# Patient Record
Sex: Female | Born: 2000 | Race: Black or African American | Hispanic: No | Marital: Single | Smoking: Never smoker
Health system: Southern US, Community
[De-identification: ages and names within clinical notes are randomized; demographics above are authoritative.]

## PROBLEM LIST (undated history)

## (undated) DIAGNOSIS — K219 Gastro-esophageal reflux disease without esophagitis: Secondary | ICD-10-CM

---

## 2018-01-22 ENCOUNTER — Emergency Department (HOSPITAL_COMMUNITY)
Admission: EM | Admit: 2018-01-22 | Discharge: 2018-01-22 | Disposition: A | Discharge status: Home / Self Care | Payer: Medicaid Other | Attending: Emergency Medicine | Admitting: Emergency Medicine

## 2018-01-22 ENCOUNTER — Encounter (HOSPITAL_COMMUNITY): Payer: Self-pay

## 2018-01-22 DIAGNOSIS — R072 Precordial pain: Secondary | ICD-10-CM | POA: Diagnosis not present

## 2018-01-22 DIAGNOSIS — R079 Chest pain, unspecified: Secondary | ICD-10-CM | POA: Diagnosis present

## 2018-01-22 MED ORDER — IBUPROFEN 600 MG PO TABS: 600.0000 mg | ORAL_TABLET | Freq: Four times a day (QID) | ORAL | 0 refills | Status: DC | PRN

## 2018-01-22 NOTE — ED Triage Notes (Signed)
Pt presents for evaluation of R sided chest pain x 3 days. Pt reports pain starts in epigastric region and has reflux as well.

## 2018-01-22 NOTE — ED Provider Notes (Signed)
[facility] EMERGENCY DEPARTMENT Provider Note   CSN: [id number] Arrival date & time: 01/22/18  1334     History   Chief Complaint Chief Complaint  Patient presents with  . Chest Pain    HPI Kathy Leonard is a 18 y.o. female.  HPI  18 year old female comes in with chief complaint of chest pain. Patient's chest pain is located on the right side, it is intermittent with each episode lasting 20 minutes.  There is no specific walking, aggravating or relieving factors associated with it.  Patient denies shortness of breath, cough. Pt has no hx of PE, DVT and denies any exogenous hormone (testosterone / estrogen) use, long distance travels or surgery in the past 6 weeks, active cancer, recent immobilization.   History reviewed. No pertinent past medical history.  There are no active problems to display for this patient.   History reviewed. No pertinent surgical history.   OB History   No obstetric history on file.      Home Medications    Prior to Admission medications   Medication Sig Start Date End Date Taking? Authorizing Provider  ibuprofen (ADVIL,MOTRIN) 600 MG tablet Take 1 tablet (600 mg total) by mouth every 6 (six) hours as needed. 01/22/18   Derwood Kaplan, MD    Family History No family history on file.  Social History Social History   Tobacco Use  . Smoking status: Not on file  Substance Use Topics  . Alcohol use: Not on file  . Drug use: Not on file     Allergies   Patient has no known allergies.   Review of Systems Review of Systems  Constitutional: Negative for activity change.  Respiratory: Negative for shortness of breath.   Cardiovascular: Positive for chest pain. Negative for palpitations.  Gastrointestinal: Negative for nausea and vomiting.     Physical Exam Updated Vital Signs BP 115/80 (BP Location: Right Arm)   Pulse 70   Temp 98.2 F (36.8 C) (Oral)   Resp 16   LMP 01/08/2018 (Exact Date)   SpO2 100%    Physical Exam Vitals signs and nursing note reviewed.  Constitutional:      Appearance: She is well-developed.  HENT:     Head: Atraumatic.  Neck:     Musculoskeletal: Neck supple.  Cardiovascular:     Rate and Rhythm: Normal rate.  Pulmonary:     Effort: Pulmonary effort is normal.     Breath sounds: Normal breath sounds.  Abdominal:     General: Bowel sounds are normal.  Musculoskeletal:     Right lower leg: She exhibits no tenderness. No edema.     Left lower leg: She exhibits no tenderness. No edema.  Skin:    General: Skin is warm and dry.  Neurological:     Mental Status: She is alert and oriented to person, place, and time.      ED Treatments / Results  Labs (all labs ordered are listed, but only abnormal results are displayed) Labs Reviewed - No data to display  EKG EKG Interpretation  Date/Time:  Sunday January 22 2018 13:42:07 EST Ventricular Rate:  66 PR Interval:  136 QRS Duration: 82 QT Interval:  378 QTC Calculation: 396 R Axis:   74 Text Interpretation:  Normal sinus rhythm Normal ECG No acute changes No old tracing to compare Confirmed by Derwood Kaplan [id number]) on 01/22/2018 1:48:39 PM   Radiology No results found.  Procedures Procedures (including critical care time)  Medications Ordered in  ED Medications - No data to display   Initial Impression / Assessment and Plan / ED Course  I have reviewed the triage vital signs and the nursing notes.  Pertinent labs & imaging results that were available during my care of the patient were reviewed by me and considered in my medical decision making (see chart for details).     Patient comes in with chief complaint of chest pain. Patient's chest pain is right-sided, intermittent and has no specific aggravating or relieving factors.  She has no known coronary artery disease, no PE risk factors.  EKG is reassuring.  Well score is 0 and patient is PERC negative, therefore I do not think we need to  pursue further PE work-up.   Final Clinical Impressions(s) / ED Diagnoses   Final diagnoses:  Precordial chest pain    ED Discharge Orders         Ordered    ibuprofen (ADVIL,MOTRIN) 600 MG tablet  Every 6 hours PRN     01/22/18 1506           Derwood Kaplan, MD 01/22/18 1520

## 2018-01-22 NOTE — Discharge Instructions (Signed)
We saw you in the ER for the chest pain. EKG looks normal. We are not sure what is causing your discomfort, but we feel comfortable sending you home at this time. The workup in the ER is not complete, and you should follow up with your primary care doctor for further evaluation if the pain were to continue.  Please return to the ER if you have worsening chest pain, shortness of breath, pain radiating to your jaw, shoulder, or back, sweats or fainting. Otherwise see the Cardiologist or your primary care doctor as requested.

## 2018-06-14 ENCOUNTER — Other Ambulatory Visit: Payer: Self-pay

## 2018-06-14 ENCOUNTER — Ambulatory Visit (HOSPITAL_COMMUNITY): Payer: 59 | Admitting: Anesthesiology

## 2018-06-14 ENCOUNTER — Ambulatory Visit (HOSPITAL_COMMUNITY)
Admission: AD | Admit: 2018-06-14 | Discharge: 2018-06-14 | Disposition: A | Discharge status: Home / Self Care | Payer: 59 | Attending: Obstetrics and Gynecology | Admitting: Obstetrics and Gynecology

## 2018-06-14 ENCOUNTER — Encounter (HOSPITAL_COMMUNITY): Payer: Self-pay | Admitting: Orthopedic Surgery

## 2018-06-14 ENCOUNTER — Encounter (HOSPITAL_COMMUNITY)
Admission: AD | Disposition: A | Discharge status: Home / Self Care | Payer: Self-pay | Source: Home / Self Care | Attending: Obstetrics and Gynecology

## 2018-06-14 DIAGNOSIS — Z3A08 8 weeks gestation of pregnancy: Secondary | ICD-10-CM | POA: Diagnosis not present

## 2018-06-14 DIAGNOSIS — O021 Missed abortion: Secondary | ICD-10-CM | POA: Insufficient documentation

## 2018-06-14 HISTORY — DX: Gastro-esophageal reflux disease without esophagitis: K21.9

## 2018-06-14 HISTORY — PX: DILATION AND EVACUATION: SHX1459

## 2018-06-14 LAB — TYPE AND SCREEN
ABO/RH(D): B POS
Antibody Screen: NEGATIVE

## 2018-06-14 SURGERY — DILATION AND EVACUATION, UTERUS
Anesthesia: General

## 2018-06-14 MED ORDER — LIDOCAINE HCL (CARDIAC) PF 100 MG/5ML IV SOSY
PREFILLED_SYRINGE | INTRAVENOUS | Status: DC | PRN
  Administered 2018-06-14: 60 mg via INTRAVENOUS

## 2018-06-14 MED ORDER — PROPOFOL 10 MG/ML IV BOLUS
INTRAVENOUS | Status: DC | PRN
  Administered 2018-06-14: 30 mg via INTRAVENOUS
  Administered 2018-06-14: 200 mg via INTRAVENOUS

## 2018-06-14 MED ORDER — PROPOFOL 10 MG/ML IV BOLUS
INTRAVENOUS | Status: AC
  Filled 2018-06-14: qty 20

## 2018-06-14 MED ORDER — LIDOCAINE HCL 1 % IJ SOLN
INTRAMUSCULAR | Status: DC | PRN
  Administered 2018-06-14: 10 mL

## 2018-06-14 MED ORDER — OXYCODONE-ACETAMINOPHEN 5-325 MG PO TABS
ORAL_TABLET | ORAL | Status: AC
  Filled 2018-06-14: qty 1

## 2018-06-14 MED ORDER — OXYCODONE HCL 5 MG PO TABS: 5.0000 mg | ORAL_TABLET | ORAL | 0 refills | Status: AC | PRN

## 2018-06-14 MED ORDER — ACETAMINOPHEN 500 MG PO TABS
1000.0000 mg | ORAL_TABLET | ORAL | Status: AC
  Administered 2018-06-14: 1000 mg via ORAL
  Filled 2018-06-14: qty 2

## 2018-06-14 MED ORDER — KETOROLAC TROMETHAMINE 30 MG/ML IJ SOLN
30.0000 mg | Freq: Once | INTRAMUSCULAR | Status: AC
  Administered 2018-06-14: 30 mg via INTRAVENOUS

## 2018-06-14 MED ORDER — LIDOCAINE HCL (PF) 1 % IJ SOLN
INTRAMUSCULAR | Status: AC
  Filled 2018-06-14: qty 30

## 2018-06-14 MED ORDER — MIDAZOLAM HCL 5 MG/5ML IJ SOLN
INTRAMUSCULAR | Status: DC | PRN
  Administered 2018-06-14: 2 mg via INTRAVENOUS

## 2018-06-14 MED ORDER — SCOPOLAMINE 1 MG/3DAYS TD PT72
MEDICATED_PATCH | TRANSDERMAL | Status: AC
  Administered 2018-06-14: 1.5 mg via TRANSDERMAL
  Filled 2018-06-14: qty 1

## 2018-06-14 MED ORDER — DOXYCYCLINE HYCLATE 100 MG IV SOLR
200.0000 mg | Freq: Once | INTRAVENOUS | Status: AC
  Administered 2018-06-14: 200 mg via INTRAVENOUS
  Filled 2018-06-14: qty 200

## 2018-06-14 MED ORDER — KETOROLAC TROMETHAMINE 30 MG/ML IJ SOLN
INTRAMUSCULAR | Status: AC
  Filled 2018-06-14: qty 1

## 2018-06-14 MED ORDER — MIDAZOLAM HCL 2 MG/2ML IJ SOLN
INTRAMUSCULAR | Status: AC
  Filled 2018-06-14: qty 2

## 2018-06-14 MED ORDER — LACTATED RINGERS IV SOLN
INTRAVENOUS | Status: DC
  Administered 2018-06-14 (×2): via INTRAVENOUS

## 2018-06-14 MED ORDER — OXYCODONE-ACETAMINOPHEN 5-325 MG PO TABS
1.0000 | ORAL_TABLET | Freq: Once | ORAL | Status: AC
  Administered 2018-06-14: 1 via ORAL

## 2018-06-14 MED ORDER — DEXAMETHASONE SODIUM PHOSPHATE 4 MG/ML IJ SOLN
INTRAMUSCULAR | Status: DC | PRN
  Administered 2018-06-14: 5 mg via INTRAVENOUS

## 2018-06-14 MED ORDER — FENTANYL CITRATE (PF) 250 MCG/5ML IJ SOLN
INTRAMUSCULAR | Status: AC
  Filled 2018-06-14: qty 5

## 2018-06-14 MED ORDER — OXYCODONE HCL 5 MG PO TABS
ORAL_TABLET | ORAL | Status: AC
  Filled 2018-06-14: qty 1

## 2018-06-14 MED ORDER — FENTANYL CITRATE (PF) 100 MCG/2ML IJ SOLN
INTRAMUSCULAR | Status: DC | PRN
  Administered 2018-06-14: 50 ug via INTRAVENOUS

## 2018-06-14 MED ORDER — ONDANSETRON HCL 4 MG/2ML IJ SOLN
INTRAMUSCULAR | Status: DC | PRN
  Administered 2018-06-14: 4 mg via INTRAVENOUS

## 2018-06-14 MED ORDER — SCOPOLAMINE 1 MG/3DAYS TD PT72
1.0000 | MEDICATED_PATCH | TRANSDERMAL | Status: DC
  Administered 2018-06-14: 1.5 mg via TRANSDERMAL
  Filled 2018-06-14: qty 1

## 2018-06-14 SURGICAL SUPPLY — 20 items
CATH ROBINSON RED A/P 16FR (CATHETERS) ×3 IMPLANT
GLOVE BIO SURGEON STRL SZ 6.5 (GLOVE) ×2 IMPLANT
GLOVE BIO SURGEONS STRL SZ 6.5 (GLOVE) ×1
GLOVE BIOGEL PI IND STRL 7.0 (GLOVE) ×1 IMPLANT
GLOVE BIOGEL PI INDICATOR 7.0 (GLOVE) ×2
GOWN STRL REUS W/ TWL LRG LVL3 (GOWN DISPOSABLE) ×2 IMPLANT
GOWN STRL REUS W/TWL LRG LVL3 (GOWN DISPOSABLE) ×4
HIBICLENS CHG 4% 4OZ BTL (MISCELLANEOUS) ×3 IMPLANT
HOSE CONNECTING 18IN BERKELEY (TUBING) ×3 IMPLANT
KIT BERKELEY 1ST TRIMESTER 3/8 (MISCELLANEOUS) ×3 IMPLANT
KIT BERKELEY 2ND TRIMESTER 1/2 (COLLECTOR) ×3 IMPLANT
PACK VAGINAL MINOR WOMEN LF (CUSTOM PROCEDURE TRAY) ×3 IMPLANT
PAD OB MATERNITY 4.3X12.25 (PERSONAL CARE ITEMS) ×3 IMPLANT
SET BERKELEY SUCTION TUBING (SUCTIONS) ×3 IMPLANT
TOWEL GREEN STERILE FF (TOWEL DISPOSABLE) ×6 IMPLANT
UNDERPAD 30X30 (UNDERPADS AND DIAPERS) ×3 IMPLANT
VACURETTE 10 RIGID CVD (CANNULA) IMPLANT
VACURETTE 7MM CVD STRL WRAP (CANNULA) IMPLANT
VACURETTE 8 RIGID CVD (CANNULA) ×3 IMPLANT
VACURETTE 9 RIGID CVD (CANNULA) IMPLANT

## 2018-06-14 NOTE — Op Note (Signed)
Operative Note    Preoperative Diagnosis: Missed abortion at 8 6/7wks   Postoperative Diagnosis: Same   Procedure: Suction dilation and evacuation   Surgeon: Mickle Mallory DO  Anesthesia: General LMA  Fluids: LR at 642ml EBL: 60ml UOP: 38ml   Findings: Uterus sounded to 10cm, anteverted   Specimen:  Products of conception   Procedure Note Consent was verified with pt. Patient was taken to the operating room where LMA anesthesia was obtained without difficulty. She was then prepped and draped in the normal sterile fashion in the dorsal lithotomy position. An appropriate time out was performed. An exam under anesthesia noted an anteverted uterus.  A speculum was then placed within the vagina and the anterior lip of the cervix identified and grasped with a single toothed tenaculum. 1% lidocaine was injected at 3 and 9 oclock.  Uterus was then sounded to 10cm.  The Pratt dilators were utilized to dilate the cervix up to approximately 25. A size 8 curved cannula was then inserted into the uterine cavity and suction begun. There was moderate amount of products evacuated. After 4 passes, a sharp curettage was then performed with a gritty texture noted in all quadrants. . Four more passes with the suction cannula were done. Minimal amount of products were returned this time.  Hence all instruments were removed from the vagina.  The tenaculum site was noted to be hemostatic.  There was scant to no bleeding from cervix. The speculum was removed from the vagina and the patient awakened and taken to the recovery room in good condition.  Counts correct per nursing staff

## 2018-06-14 NOTE — Anesthesia Procedure Notes (Signed)
Procedure Name: LMA Insertion Date/Time: 06/14/2018 5:53 PM Performed by: Oletta Lamas, CRNA Pre-anesthesia Checklist: Patient identified, Emergency Drugs available, Suction available and Patient being monitored Patient Re-evaluated:Patient Re-evaluated prior to induction Oxygen Delivery Method: Circle System Utilized Preoxygenation: Pre-oxygenation with 100% oxygen Induction Type: IV induction Ventilation: Mask ventilation without difficulty LMA: LMA inserted LMA Size: 4.0 Number of attempts: 1 Placement Confirmation: positive ETCO2 Tube secured with: Tape Dental Injury: Teeth and Oropharynx as per pre-operative assessment

## 2018-06-14 NOTE — Transfer of Care (Signed)
Immediate Anesthesia Transfer of Care Note  Patient: Kathy Leonard  Procedure(s) Performed: SUCTION DILATATION AND EVACUATION (N/A )  Patient Location: PACU  Anesthesia Type:General  Level of Consciousness: awake, drowsy and patient cooperative  Airway & Oxygen Therapy: Patient Spontanous Breathing  Post-op Assessment: Report given to RN and Post -op Vital signs reviewed and stable  Post vital signs: Reviewed and stable  Last Vitals:  Vitals Value Taken Time  BP    Temp 36.7 C 06/14/2018  8:32 PM  Pulse 83 06/14/2018  8:32 PM  Resp 18 06/14/2018  8:32 PM  SpO2 100 % 06/14/2018  8:32 PM    Last Pain:  Vitals:   06/14/18 2032  PainSc: 0-No pain      Patients Stated Pain Goal: 3 (38/10/17 5102)  Complications: No apparent anesthesia complications

## 2018-06-14 NOTE — Discharge Instructions (Signed)
Call office with any concerns [phone] 

## 2018-06-14 NOTE — H&P (Signed)
Kathy Leonard is an 18 y.o. G1 female with missed abortion diagnosed yesterday when she had a pregnancy confirmation appointment. Pt has had no bleeding or cramping. She had initially wanted conservative management but overnight decided on surgical management due to anxiety about bleeding. She has no chronic medical conditions. She measured 8wks and 6days per dates on [place].   Pertinent Gynecological History: DES exposure: unknown Blood transfusions: none Sexually transmitted diseases: none Previous GYN Procedures: none  Last mammogram: never Last pap: never OB History: G1, P0010  Menstrual History: Menarche age: 76 No LMP recorded.    No past medical history on file.  No past surgical history on file.  No family history on file.  Social History:  has no history on file for tobacco, alcohol, and drug.  Allergies: No Known Allergies  No medications prior to admission.    ROS  There were no vitals taken for this visit. Physical Exam  No results found for this or any previous visit (from the past 24 hour(s)).  No results found.  Assessment/Plan: Prime with 8wk missed abortion opting for surgical management via Suction Dilation and Evacuation Procedure reviewed and all questions including risks/benefits answered  Doxycycline pre and post op ERAS protocol To OR when ready; consent confirmed  Isaiah Serge 06/14/2018, 3:14 PM

## 2018-06-14 NOTE — OR Nursing (Signed)
Per Dr. Terri Piedra, the [facility] chromosome test for products of conception needs to be kept in the lab refrigerator until tomorrow, 06/15/2018. Specimen prepared by Dr. Terri Piedra in [place] [facility] per instructions on test kit insert and placed back in manufacturer's box. Box placed in biohazard bag and walked to lab by circulator in room, Nash Mantis, RN. Spoke directly to lab staff, Shirlean Schlein, verbalizing instructions per Dr. Terri Piedra. Lab staff placed the specimen container box in lab refrigerator and made note on the lab communication board about the test. Dr. Terri Piedra to follow up.

## 2018-06-14 NOTE — Interval H&P Note (Signed)
History and Physical Interval Note: Pt seen. No new changes. Ready to proceed when OR ready  06/14/2018 6:52 PM  ZOXWRUEA Kreiter  has presented today for surgery, with the diagnosis of mis abortion.  The various methods of treatment have been discussed with the patient and family. After consideration of risks, benefits and other options for treatment, the patient has consented to  Procedure(s): SUCTION DILATATION AND EVACUATION (N/A) as a surgical intervention.  The patient's history has been reviewed, patient examined, no change in status, stable for surgery.  I have reviewed the patient's chart and labs.  Questions were answered to the patient's satisfaction.     Kathy Leonard

## 2018-06-15 ENCOUNTER — Encounter (HOSPITAL_COMMUNITY): Payer: Self-pay | Admitting: Obstetrics and Gynecology

## 2018-06-15 LAB — ABO/RH: ABO/RH(D): B POS

## 2018-06-19 ENCOUNTER — Inpatient Hospital Stay (HOSPITAL_COMMUNITY): Payer: 59

## 2018-06-19 ENCOUNTER — Encounter (HOSPITAL_COMMUNITY): Payer: Self-pay | Admitting: *Deleted

## 2018-06-19 ENCOUNTER — Inpatient Hospital Stay (HOSPITAL_COMMUNITY)
Admission: AD | Admit: 2018-06-19 | Discharge: 2018-06-20 | Disposition: A | Discharge status: Home / Self Care | Payer: 59 | Attending: Obstetrics and Gynecology | Admitting: Obstetrics and Gynecology

## 2018-06-19 ENCOUNTER — Other Ambulatory Visit: Payer: Self-pay

## 2018-06-19 DIAGNOSIS — Z8759 Personal history of other complications of pregnancy, childbirth and the puerperium: Secondary | ICD-10-CM | POA: Diagnosis not present

## 2018-06-19 DIAGNOSIS — R109 Unspecified abdominal pain: Secondary | ICD-10-CM

## 2018-06-19 DIAGNOSIS — R1033 Periumbilical pain: Secondary | ICD-10-CM | POA: Insufficient documentation

## 2018-06-19 DIAGNOSIS — R1011 Right upper quadrant pain: Secondary | ICD-10-CM | POA: Diagnosis not present

## 2018-06-19 LAB — COMPREHENSIVE METABOLIC PANEL
ALT: 45 U/L — ABNORMAL HIGH (ref 0–44)
AST: 27 U/L (ref 15–41)
Albumin: 3.5 g/dL (ref 3.5–5.0)
Alkaline Phosphatase: 51 U/L (ref 38–126)
Anion gap: 8 (ref 5–15)
BUN: 8 mg/dL (ref 6–20)
CO2: 23 mmol/L (ref 22–32)
Calcium: 9.1 mg/dL (ref 8.9–10.3)
Chloride: 105 mmol/L (ref 98–111)
Creatinine, Ser: 0.73 mg/dL (ref 0.44–1.00)
GFR calc Af Amer: >60 mL/min (ref >60)
GFR calc non Af Amer: >60 mL/min (ref >60)
Glucose, Bld: 97 mg/dL (ref 70–99)
Potassium: 3.6 mmol/L (ref 3.5–5.1)
Sodium: 136 mmol/L (ref 135–145)
Total Bilirubin: 0.4 mg/dL (ref 0.3–1.2)
Total Protein: 6.8 g/dL (ref 6.5–8.1)

## 2018-06-19 LAB — CBC
HCT: 35.8 % — ABNORMAL LOW (ref 36.0–46.0)
Hemoglobin: 11.9 g/dL — ABNORMAL LOW (ref 12.0–15.0)
MCH: 30.4 pg (ref 26.0–34.0)
MCHC: 33.2 g/dL (ref 30.0–36.0)
MCV: 91.3 fL (ref 80.0–100.0)
Platelets: 217 10*3/uL (ref 150–400)
RBC: 3.92 MIL/uL (ref 3.87–5.11)
RDW: 11.9 % (ref 11.5–15.5)
WBC: 6.2 10*3/uL (ref 4.0–10.5)
nRBC: 0 % (ref 0.0–0.2)

## 2018-06-19 MED ORDER — IOHEXOL 300 MG/ML  SOLN
100.0000 mL | Freq: Once | INTRAMUSCULAR | Status: AC | PRN
  Administered 2018-06-19: 100 mL via INTRAVENOUS

## 2018-06-19 MED ORDER — LACTATED RINGERS IV SOLN
Freq: Once | INTRAVENOUS | Status: AC
  Administered 2018-06-19: 23:00:00 via INTRAVENOUS

## 2018-06-19 NOTE — MAU Note (Addendum)
C/o of mid abdominal pain since Saturday, Has D&E on on 6/10, bleeding and passing clots.  Had 800mg  ibuprofen around 1700.

## 2018-06-19 NOTE — MAU Provider Note (Addendum)
History     CSN: [id number]  Arrival date and time: 06/19/18 1749   First Provider Initiated Contact with Patient 06/19/18 1916      Chief Complaint  Patient presents with  . Abdominal Cramping  . recent DC   HPI Kathy Leonard 18 y.o. Comes to MAU today for evaluation of umbilical pain - S/P D&C on 06-14-18.  Was seen in the office earlier today for this problem.  Unknown if this is a problem associated with the D&C.  Sent to the hospital for further evaluation and ultrasound.  Bleeding was lighter than menses but yesterday the bleeding increased and today she has had tangerine sized clots.  Has been taking ibuprofen since the surgery.  Ibuprofen does not relieve the pain associated with the umbilicus.  Took Ibuprofen last at 11 am and 5 pm.  Currently pain is 7/10 when lying still and worse when moving.  When the pain started on Saturday it was 5/10.  Client reports looser stools on Saturday and stools every other day - not daily like they had been before the D&C.  Today had 5 chicken wings to eat - normal diet.  OB History   No obstetric history on file.     Past Medical History:  Diagnosis Date  . GERD (gastroesophageal reflux disease)     Past Surgical History:  Procedure Laterality Date  . DILATION AND EVACUATION N/A 06/14/2018   Procedure: SUCTION DILATATION AND EVACUATION;  Surgeon: Sherlyn Hay, DO;  Location: [place];  Service: Gynecology;  Laterality: N/A;    No family history on file.  Social History   Tobacco Use  . Smoking status: Never Smoker  . Smokeless tobacco: Never Used  Substance Use Topics  . Alcohol use: Never    Frequency: Never  . Drug use: Never    Allergies: No Known Allergies  Medications Prior to Admission  Medication Sig Dispense Refill Last Dose  . ibuprofen (ADVIL,MOTRIN) 600 MG tablet Take 1 tablet (600 mg total) by mouth every 6 (six) hours as needed. 20 tablet 0     Review of Systems  Constitutional: Negative for fever.   Respiratory: Negative for cough and shortness of breath.   Gastrointestinal: Positive for abdominal pain. Negative for constipation, diarrhea, nausea and vomiting.  Genitourinary: Positive for vaginal bleeding. Negative for dysuria and vaginal discharge.   Physical Exam   Blood pressure 129/74, pulse 72, temperature 97.6 F (36.4 C), temperature source Oral, resp. rate 16, SpO2 100 %.  Physical Exam  Nursing note and vitals reviewed. Constitutional: She is oriented to person, place, and time. She appears well-developed and well-nourished.  HENT:  Head: Normocephalic.  Eyes: EOM are normal.  Neck: Neck supple.  GI: Soft. There is abdominal tenderness. There is no rebound and no guarding.  Tenderness is around and under the umbilicus.  Client had to remove her umbilical piercing as it became to uncomfortable to wear.  No evidence of swelling or erythema near the piercing site.  Most of the pain is below the umbilicus but well above the level of the uterus.  Very mild tenderness in the low midline.  Worst pain is just below the umbilicus where the patient has guarding and full palpation cannot be done.  There is no hard areas palpated.  No visible distortion.  No bulging with movement.  No diastasis palpated.  Musculoskeletal: Normal range of motion.  Neurological: She is alert and oriented to person, place, and time.  Skin: Skin is warm  and dry.  Psychiatric: She has a normal mood and affect.    MAU Course  Procedures Results for orders placed or performed during the hospital encounter of 06/19/18 (from the past 24 hour(s))  CBC     Status: Abnormal   Collection Time: 06/19/18  7:31 PM  Result Value Ref Range   WBC 6.2 4.0 - 10.5 K/uL   RBC 3.92 3.87 - 5.11 MIL/uL   Hemoglobin 11.9 (L) 12.0 - 15.0 g/dL   HCT 09.835.8 (L) 11.936.0 - 14.746.0 %   MCV 91.3 80.0 - 100.0 fL   MCH 30.4 26.0 - 34.0 pg   MCHC 33.2 30.0 - 36.0 g/dL   RDW 82.911.9 56.211.5 - 13.015.5 %   Platelets 217 150 - 400 K/uL   nRBC 0.0  0.0 - 0.2 %   CLINICAL DATA:  Umbilical/mid abdominal pain pain. Five days after D and C.  EXAM: TRANSABDOMINAL ULTRASOUND OF PELVIS  TECHNIQUE: Transabdominal ultrasound examination of the pelvis was performed including evaluation of the uterus, ovaries, adnexal regions, and pelvic cul-de-sac.  COMPARISON:  None.  FINDINGS: Uterus  Measurements: 7.6 x 4.4 x 4.6 cm = volume: 81.2 mL. No fibroids or other mass visualized.  Endometrium  Thickness: 5.4 mm. No focal abnormality visualized. No endometrial vascularity.  Right ovary  Measurements: 3.5 x 1.3 x 1.5 cm = volume: 3.5 mL. Normal appearance/no adnexal mass.  Left ovary  Measurements: 4.1 x 2.3 x 2 cm = volume: 9.9 mL. Normal appearance/no adnexal mass.  Other findings:  Trace free fluid in the right adnexa.  IMPRESSION: Unremarkable pelvic ultrasound. Thin endometrium without endometrial vascularity.  CLINICAL DATA:  Umbilical pain  EXAM: ULTRASOUND ABDOMEN LIMITED RIGHT UPPER QUADRANT  COMPARISON:  None.  FINDINGS: Gallbladder:  No gallstones or wall thickening visualized. No sonographic Murphy sign noted by sonographer.  Common bile duct:  Diameter: 4.3 mm  Liver:  No focal lesion identified. Within normal limits in parenchymal echogenicity. Portal vein is patent on color Doppler imaging with normal direction of blood flow towards the liver.  IMPRESSION: Negative right upper quadrant ultrasound   MDM CBC done.  WBC normal. Nothing found on pelvic US and RUQ ultrasound.  Umbilicus was not evaluated with ultrasound.  Continues to have pain at the site the same as in the earlier exam. Consult with Dr. Macon LargeAnyanwu and Dr. Mindi SlickerBanga - ordered Complete abdominal CT with contrast.  Discussed with patient.  IVF started and CMP ordered. Care assumed by Lanice ShirtsV Kemo Spruce, CMN at 2130  Terri L Burleson 06/19/2018,9:34PM  CT pending upon care being assumed   0020: CT results reviewed    Ct Abdomen Pelvis W Contrast  Result Date: 06/20/2018 CLINICAL DATA:  Umbilical pain EXAM: CT ABDOMEN AND PELVIS WITH CONTRAST TECHNIQUE: Multidetector CT imaging of the abdomen and pelvis was performed using the standard protocol following bolus administration of intravenous contrast. CONTRAST:  100mL OMNIPAQUE IOHEXOL 300 MG/ML  SOLN COMPARISON:  Right upper quadrant and pelvic ultrasounds FINDINGS: Lower chest: Lung bases are clear. No effusions. Heart is normal size. Hepatobiliary: No focal hepatic abnormality. Gallbladder unremarkable. Pancreas: No focal abnormality or ductal dilatation. Spleen: No focal abnormality.  Normal size. Adrenals/Urinary Tract: No adrenal abnormality. No focal renal abnormality. No stones or hydronephrosis. Urinary bladder is unremarkable. Stomach/Bowel: Stomach, large and small bowel grossly unremarkable. Normal appendix. Vascular/Lymphatic: No evidence of aneurysm or adenopathy. Reproductive: Uterus and adnexa unremarkable.  No mass. Other: No free fluid or free air. Musculoskeletal: No acute bony abnormality. IMPRESSION: Normal study. Electronically  Signed   By: Charlett NoseKevin  Dover M.D.   On: 06/20/2018 00:06   Normal CT no abnormalities found on study, physical assessment of abdomen performed by CNM prior to discharge home.  IV 1mg  Dilaudid given for pain prior to assessment.  Abdominal pain and tenderness at 4-5 o'clock of umbilicus, less than 1cm bulge present where tenderness associated when patient performs sit up- possible early hernia unable to seen on CT or US.   Educated and discussed watching area for changes in color, size or increased pain. Instructed to rest and relax over next couple of days and not lift anything heavy, patient verbalizes understanding. [facility] with any of the above symptoms. Follow up as scheduled in the [facility] for post [facility] appointment. Rx for Ultram sent to pharmacy for abdominal pain. Pt  stable at time of discharge.   Assessment and Plan   1. Umbilical pain   2. Abdominal cramping    Discharge home  Umbilical hernia precautions, s/s and reasons to [facility]  Rest and relaxation of abdominal muscles over next 1-2 days  Follow up as scheduled in office for postop appointment   Follow-up Information    [facility].   Why: Follow up as scheduled in the office in the morning  Contact information: [address]  [address] [place] [address] [phone]          Sharyon CableVeronica C Jamyria Ozanich, CNM 06/20/18, 1:37 AM

## 2018-06-20 ENCOUNTER — Encounter (HOSPITAL_COMMUNITY): Payer: Self-pay | Admitting: Obstetrics and Gynecology

## 2018-06-20 DIAGNOSIS — R109 Unspecified abdominal pain: Secondary | ICD-10-CM

## 2018-06-20 DIAGNOSIS — R1033 Periumbilical pain: Secondary | ICD-10-CM

## 2018-06-20 MED ORDER — TRAMADOL HCL 50 MG PO TABS: 50.0000 mg | ORAL_TABLET | Freq: Four times a day (QID) | ORAL | 0 refills | Status: AC | PRN

## 2018-06-20 MED ORDER — HYDROMORPHONE HCL 1 MG/ML IJ SOLN
1.0000 mg | Freq: Once | INTRAMUSCULAR | Status: AC
  Administered 2018-06-20: 1 mg via INTRAVENOUS
  Filled 2018-06-20: qty 1

## 2018-06-20 NOTE — Anesthesia Preprocedure Evaluation (Addendum)
Anesthesia Evaluation  Patient identified by MRN, date of birth, ID band Patient awake    Reviewed: Allergy & Precautions, NPO status , Patient's Chart, lab work & pertinent test results  History of Anesthesia Complications Negative for: history of anesthetic complications  Airway Mallampati: II  TM Distance: >3 FB Neck ROM: Full    Dental  (+) Dental Advisory Given   Pulmonary neg pulmonary ROS,    breath sounds clear to auscultation       Cardiovascular negative cardio ROS   Rhythm:Regular     Neuro/Psych negative neurological ROS  negative psych ROS   GI/Hepatic Neg liver ROS, GERD  Controlled,  Endo/Other  negative endocrine ROS  Renal/GU negative Renal ROS     Musculoskeletal negative musculoskeletal ROS (+)   Abdominal   Peds  Hematology negative hematology ROS (+)   Anesthesia Other Findings   Reproductive/Obstetrics Missed abortion                             Anesthesia Physical Anesthesia Plan  ASA: I  Anesthesia Plan: General   Post-op Pain Management:    Induction: Intravenous  PONV Risk Score and Plan: 3 and Treatment may vary due to age or medical condition, Propofol infusion, Ondansetron and Dexamethasone  Airway Management Planned: LMA and Oral ETT  Additional Equipment: None  Intra-op Plan:   Post-operative Plan: Extubation in OR  Informed Consent: I have reviewed the patients History and Physical, chart, labs and discussed the procedure including the risks, benefits and alternatives for the proposed anesthesia with the patient or authorized representative who has indicated his/her understanding and acceptance.     Dental advisory given  Plan Discussed with: CRNA and Surgeon  Anesthesia Plan Comments:        Anesthesia Quick Evaluation

## 2018-06-20 NOTE — Anesthesia Postprocedure Evaluation (Signed)
Anesthesia Post Note  Patient: Kathy Leonard  Procedure(s) Performed: SUCTION DILATATION AND EVACUATION (N/A )     Patient location during evaluation: PACU Anesthesia Type: General Level of consciousness: awake and alert Pain management: pain level controlled Vital Signs Assessment: post-procedure vital signs reviewed and stable Respiratory status: spontaneous breathing, nonlabored ventilation, respiratory function stable and patient connected to nasal cannula oxygen Cardiovascular status: blood pressure returned to baseline and stable Postop Assessment: no apparent nausea or vomiting Anesthetic complications: no    Last Vitals:  Vitals:   06/14/18 2045 06/14/18 2100  BP: 121/80 113/66  Pulse: 63 63  Resp: 15 17  Temp:  36.7 C  SpO2: 98% 100%    Last Pain:  Vitals:   06/14/18 2100  PainSc: 0-No pain                 Rakeem Colley

## 2018-09-25 ENCOUNTER — Emergency Department (HOSPITAL_COMMUNITY)
Admission: EM | Admit: 2018-09-25 | Discharge: 2018-09-26 | Disposition: A | Discharge status: Home / Self Care | Payer: 59 | Attending: Emergency Medicine | Admitting: Emergency Medicine

## 2018-09-25 ENCOUNTER — Encounter (HOSPITAL_COMMUNITY): Payer: Self-pay

## 2018-09-25 ENCOUNTER — Other Ambulatory Visit: Payer: Self-pay

## 2018-09-25 DIAGNOSIS — R1032 Left lower quadrant pain: Secondary | ICD-10-CM | POA: Diagnosis present

## 2018-09-25 DIAGNOSIS — Z5321 Procedure and treatment not carried out due to patient leaving prior to being seen by health care provider: Secondary | ICD-10-CM | POA: Insufficient documentation

## 2018-09-25 LAB — COMPREHENSIVE METABOLIC PANEL
ALT: 18 U/L (ref 0–44)
AST: 16 U/L (ref 15–41)
Albumin: 3.8 g/dL (ref 3.5–5.0)
Alkaline Phosphatase: 50 U/L (ref 38–126)
Anion gap: 7 (ref 5–15)
BUN: 12 mg/dL (ref 6–20)
CO2: 22 mmol/L (ref 22–32)
Calcium: 9.4 mg/dL (ref 8.9–10.3)
Chloride: 108 mmol/L (ref 98–111)
Creatinine, Ser: 0.84 mg/dL (ref 0.44–1.00)
GFR calc Af Amer: >60 mL/min (ref >60)
GFR calc non Af Amer: >60 mL/min (ref >60)
Glucose, Bld: 93 mg/dL (ref 70–99)
Potassium: 3.9 mmol/L (ref 3.5–5.1)
Sodium: 137 mmol/L (ref 135–145)
Total Bilirubin: 0.2 mg/dL — ABNORMAL LOW (ref 0.3–1.2)
Total Protein: 7.4 g/dL (ref 6.5–8.1)

## 2018-09-25 LAB — CBC
HCT: 38.4 % (ref 36.0–46.0)
Hemoglobin: 13.1 g/dL (ref 12.0–15.0)
MCH: 31.2 pg (ref 26.0–34.0)
MCHC: 34.1 g/dL (ref 30.0–36.0)
MCV: 91.4 fL (ref 80.0–100.0)
Platelets: 178 10*3/uL (ref 150–400)
RBC: 4.2 MIL/uL (ref 3.87–5.11)
RDW: 11.4 % — ABNORMAL LOW (ref 11.5–15.5)
WBC: 8.8 10*3/uL (ref 4.0–10.5)
nRBC: 0 % (ref 0.0–0.2)

## 2018-09-25 LAB — I-STAT BETA HCG BLOOD, ED (MC, WL, AP ONLY): I-stat hCG, quantitative: <5 m[IU]/mL (ref <5)

## 2018-09-25 LAB — LIPASE, BLOOD: Lipase: 34 U/L (ref 11–51)

## 2018-09-25 MED ORDER — SODIUM CHLORIDE 0.9% FLUSH: 3.0000 mL | Freq: Once | INTRAVENOUS | Status: DC

## 2018-09-25 NOTE — ED Triage Notes (Signed)
Pt from home with ems for LLQ pain for the past 12 hrs. Pt had IUD removed recently and has not had a period since then. Denies any n.v at this time.

## 2018-09-28 ENCOUNTER — Encounter (INDEPENDENT_AMBULATORY_CARE_PROVIDER_SITE_OTHER): Payer: Self-pay | Admitting: Primary Care

## 2018-09-28 ENCOUNTER — Ambulatory Visit (INDEPENDENT_AMBULATORY_CARE_PROVIDER_SITE_OTHER): Payer: 59 | Admitting: Primary Care

## 2018-09-28 ENCOUNTER — Other Ambulatory Visit: Payer: Self-pay

## 2018-09-28 VITALS — BP 111/73 | HR 80 | Temp 98.5°F | Ht 66.0 in | Wt 177.2 lb

## 2018-09-28 DIAGNOSIS — Z7689 Persons encountering health services in other specified circumstances: Secondary | ICD-10-CM

## 2018-09-28 DIAGNOSIS — R1084 Generalized abdominal pain: Secondary | ICD-10-CM | POA: Diagnosis not present

## 2018-09-28 NOTE — Progress Notes (Signed)
New Patient Office Visit  Subjective:  Patient ID: Kathy Leonard, female    DOB: 09-Apr-2000  Age: 18 y.o. MRN: [id number]  CC:  Chief Complaint  Patient presents with  . New Patient (Initial Visit)    abdominal pain since having IUD removed    HPI Kathy Leonard presents to establish care with PCP. Her main concern is abdominal cramping since removal of IUD. Present to the emergency room on 09/26/2018 for abdominal pain with intermittent nausea, vomiting and diarrhea.   Past Medical History:  Diagnosis Date  . GERD (gastroesophageal reflux disease)     Past Surgical History:  Procedure Laterality Date  . DILATION AND EVACUATION N/A 06/14/2018   Procedure: SUCTION DILATATION AND EVACUATION;  Surgeon: Sherlyn Hay, DO;  Location: [facility];  Service: Gynecology;  Laterality: N/A;    No family history on file.  Social History   Socioeconomic History  . Marital status: Single    Spouse name: Not on file  . Number of children: Not on file  . Years of education: Not on file  . Highest education level: Not on file  Occupational History  . Not on file  Social Needs  . Financial resource strain: Not on file  . Food insecurity    Worry: Not on file    Inability: Not on file  . Transportation needs    Medical: Not on file    Non-medical: Not on file  Tobacco Use  . Smoking status: Never Smoker  . Smokeless tobacco: Never Used  Substance and Sexual Activity  . Alcohol use: Never    Frequency: Never  . Drug use: Never  . Sexual activity: Not on file  Lifestyle  . Physical activity    Days per week: Not on file    Minutes per session: Not on file  . Stress: Not on file  Relationships  . Social Herbalist on phone: Not on file    Gets together: Not on file    Attends religious service: Not on file    Active member of club or organization: Not on file    Attends meetings of clubs or organizations: Not on file    Relationship status: Not on file   . Intimate partner violence    Fear of current or ex partner: Not on file    Emotionally abused: Not on file    Physically abused: Not on file    Forced sexual activity: Not on file  Other Topics Concern  . Not on file  Social History Narrative  . Not on file    ROS Review of Systems  Gastrointestinal: Positive for abdominal pain, diarrhea and nausea.       Intermittent   Genitourinary: Positive for menstrual problem.    Objective:   Today's Vitals: BP 111/73 (BP Location: Right Arm, Patient Position: Sitting, Cuff Size: Normal)   Pulse 80   Temp 98.5 F (36.9 C) (Oral)   Ht 5\' 6"  (1.676 m)   Wt 177 lb 3.2 oz (80.4 kg)   LMP 08/21/2018 (Exact Date)   SpO2 99%   BMI 28.60 kg/m   Physical Exam Vitals signs reviewed.  Constitutional:      Appearance: Normal appearance.  HENT:     Head: Normocephalic.     Right Ear: Tympanic membrane normal.     Left Ear: Tympanic membrane normal.  Eyes:     Extraocular Movements: Extraocular movements intact.     Pupils: Pupils are  equal, round, and reactive to light.  Neck:     Musculoskeletal: Normal range of motion.  Cardiovascular:     Rate and Rhythm: Normal rate and regular rhythm.  Pulmonary:     Effort: Pulmonary effort is normal.     Breath sounds: Normal breath sounds.  Abdominal:     General: Abdomen is flat.     Palpations: Abdomen is soft.  Musculoskeletal: Normal range of motion.  Skin:    General: Skin is warm and dry.  Neurological:     Mental Status: She is alert and oriented to person, place, and time.  Psychiatric:        Mood and Affect: Mood normal.        Behavior: Behavior normal.     Assessment & Plan:  Airiana was seen today for new patient (initial visit).  Diagnoses and all orders for this visit:  Encounter to establish care [name], NP-C will be your  (PCP) that will  provides both the first contact for a person with an undiagnosed health concern as well as continuing care of  varied medical conditions, not limited by cause, organ system, or diagnosis.   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 Prescribe ibuprofen and if no improvement schedule in person visit in 1-2 weeks     Outpatient Encounter Medications as of 09/28/2018  Medication Sig  . ibuprofen (ADVIL,MOTRIN) 600 MG tablet Take 1 tablet (600 mg total) by mouth every 6 (six) hours as needed.   No facility-administered encounter medications on file as of 09/28/2018.     Follow-up: Return if symptoms worsen or fail to improve.  Time included review of encounters, labs and imaging Kathy Sessions, NP

## 2018-09-28 NOTE — Patient Instructions (Signed)
Abdominal Pain, Adult Abdominal pain can be caused by many things. Often, abdominal pain is not serious and it gets better with no treatment or by being treated at home. However, sometimes abdominal pain is serious. Your health care provider will do a medical history and a physical exam to try to determine the cause of your abdominal pain. Follow these instructions at home:  Take over-the-counter and prescription medicines only as told by your health care provider. Do not take a laxative unless told by your health care provider.  Drink enough fluid to keep your urine clear or pale yellow.  Watch your condition for any changes.  Keep all follow-up visits as told by your health care provider. This is important. Contact a health care provider if:  Your abdominal pain changes or gets worse.  You are not hungry or you lose weight without trying.  You are constipated or have diarrhea for more than 2-3 days.  You have pain when you urinate or have a bowel movement.  Your abdominal pain wakes you up at night.  Your pain gets worse with meals, after eating, or with certain foods.  You are throwing up and cannot keep anything down.  You have a fever. Get help right away if:  Your pain does not go away as soon as your health care provider told you to expect.  You cannot stop throwing up.  Your pain is only in areas of the abdomen, such as the right side or the left lower portion of the abdomen.  You have bloody or black stools, or stools that look like tar.  You have severe pain, cramping, or bloating in your abdomen.  You have signs of dehydration, such as: ? Dark urine, very little urine, or no urine. ? Cracked lips. ? Dry mouth. ? Sunken eyes. ? Sleepiness. ? Weakness. This information is not intended to replace advice given to you by your health care provider. Make sure you discuss any questions you have with your health care provider. Document Released: 09/30/2004 Document  Revised: 07/11/2015 Document Reviewed: 06/04/2015 Elsevier Interactive Patient Education  2020 [employer]  

## 2018-10-18 ENCOUNTER — Encounter (INDEPENDENT_AMBULATORY_CARE_PROVIDER_SITE_OTHER): Payer: Self-pay

## 2018-10-18 ENCOUNTER — Telehealth (INDEPENDENT_AMBULATORY_CARE_PROVIDER_SITE_OTHER): Payer: Medicaid Other | Admitting: Primary Care

## 2018-10-19 ENCOUNTER — Telehealth (INDEPENDENT_AMBULATORY_CARE_PROVIDER_SITE_OTHER): Payer: Medicaid Other | Admitting: Primary Care

## 2018-11-06 ENCOUNTER — Ambulatory Visit (INDEPENDENT_AMBULATORY_CARE_PROVIDER_SITE_OTHER): Payer: 59 | Admitting: Primary Care

## 2018-11-11 ENCOUNTER — Encounter (HOSPITAL_COMMUNITY): Payer: Self-pay

## 2018-11-11 ENCOUNTER — Other Ambulatory Visit: Payer: Self-pay

## 2018-11-11 ENCOUNTER — Emergency Department (HOSPITAL_COMMUNITY)
Admission: EM | Admit: 2018-11-11 | Discharge: 2018-11-12 | Disposition: A | Discharge status: Home / Self Care | Payer: 59 | Attending: Emergency Medicine | Admitting: Emergency Medicine

## 2018-11-11 DIAGNOSIS — R110 Nausea: Secondary | ICD-10-CM | POA: Diagnosis not present

## 2018-11-11 DIAGNOSIS — R197 Diarrhea, unspecified: Secondary | ICD-10-CM | POA: Diagnosis not present

## 2018-11-11 DIAGNOSIS — R1033 Periumbilical pain: Secondary | ICD-10-CM

## 2018-11-11 DIAGNOSIS — R109 Unspecified abdominal pain: Secondary | ICD-10-CM | POA: Diagnosis present

## 2018-11-11 LAB — COMPREHENSIVE METABOLIC PANEL
ALT: 32 U/L (ref 0–44)
AST: 25 U/L (ref 15–41)
Albumin: 4.2 g/dL (ref 3.5–5.0)
Alkaline Phosphatase: 60 U/L (ref 38–126)
Anion gap: 10 (ref 5–15)
BUN: 10 mg/dL (ref 6–20)
CO2: 23 mmol/L (ref 22–32)
Calcium: 9.2 mg/dL (ref 8.9–10.3)
Chloride: 107 mmol/L (ref 98–111)
Creatinine, Ser: 0.78 mg/dL (ref 0.44–1.00)
GFR calc Af Amer: >60 mL/min (ref >60)
GFR calc non Af Amer: >60 mL/min (ref >60)
Glucose, Bld: 77 mg/dL (ref 70–99)
Potassium: 3.6 mmol/L (ref 3.5–5.1)
Sodium: 140 mmol/L (ref 135–145)
Total Bilirubin: 0.6 mg/dL (ref 0.3–1.2)
Total Protein: 8.3 g/dL — ABNORMAL HIGH (ref 6.5–8.1)

## 2018-11-11 LAB — LIPASE, BLOOD: Lipase: 34 U/L (ref 11–51)

## 2018-11-11 LAB — CBC
HCT: 41.3 % (ref 36.0–46.0)
Hemoglobin: 13.6 g/dL (ref 12.0–15.0)
MCH: 30.6 pg (ref 26.0–34.0)
MCHC: 32.9 g/dL (ref 30.0–36.0)
MCV: 92.8 fL (ref 80.0–100.0)
Platelets: 197 10*3/uL (ref 150–400)
RBC: 4.45 MIL/uL (ref 3.87–5.11)
RDW: 12 % (ref 11.5–15.5)
WBC: 6.6 10*3/uL (ref 4.0–10.5)
nRBC: 0 % (ref 0.0–0.2)

## 2018-11-11 LAB — URINALYSIS, ROUTINE W REFLEX MICROSCOPIC
Bilirubin Urine: NEGATIVE
Glucose, UA: NEGATIVE mg/dL
Hgb urine dipstick: NEGATIVE
Ketones, ur: NEGATIVE mg/dL
Nitrite: NEGATIVE
Protein, ur: 30 mg/dL — AB
Specific Gravity, Urine: 1.029 (ref 1.005–1.030)
pH: 7 (ref 5.0–8.0)

## 2018-11-11 LAB — I-STAT BETA HCG BLOOD, ED (MC, WL, AP ONLY): I-stat hCG, quantitative: <5 m[IU]/mL (ref <5)

## 2018-11-11 MED ORDER — ONDANSETRON HCL 4 MG PO TABS: 4.0000 mg | ORAL_TABLET | Freq: Three times a day (TID) | ORAL | 0 refills | Status: DC | PRN

## 2018-11-11 MED ORDER — DICYCLOMINE HCL 20 MG PO TABS: 20.0000 mg | ORAL_TABLET | Freq: Two times a day (BID) | ORAL | 0 refills | Status: DC | PRN

## 2018-11-11 NOTE — ED Provider Notes (Signed)
[facility] DEPT Provider Note   CSN: [id number] Arrival date & time: 11/11/18  1930     History   Chief Complaint Chief Complaint  Patient presents with   Abdominal Pain    HPI Kathy Leonard is a 18 y.o. female with history of GERD presents today for evaluation of acute onset, persistent abdominal pain for several months.  Reports that symptoms have been ongoing since at least August.  She reports daily lower abdominal pain and occasional periumbilical pain.  She has been experiencing periumbilical pain for the last 2 days which she describes as sharp, worsens with leaning forward, improves laying flat and resting.  She notes nausea and experiences nonbloody diarrhea daily which worsens depending on what she eats.  She states that she does have a diet that consists of a fair amount of fatty and fried foods.  No vomiting, no urinary symptoms, no fevers, shortness of breath, cough, or chest pain.  Denies recent travel, sick contacts, or recent treatment with antibiotics.  Reports that she has had irregular periods since she had a D&C in June and IUD removal in August.  She has had multiple ED work-ups in the last 6 months for her symptoms with no evidence of acute surgical abdominal pathology.  She also establish care with a PCP on 09/28/2018 for her symptoms who recommended 2-week course of ibuprofen and virtual visit for follow-up but she states she was unable to follow-up virtually.  She has not been taking anything for her symptoms.     The history is provided by the patient.    Past Medical History:  Diagnosis Date   GERD (gastroesophageal reflux disease)     There are no active problems to display for this patient.   Past Surgical History:  Procedure Laterality Date   DILATION AND EVACUATION N/A 06/14/2018   Procedure: SUCTION DILATATION AND EVACUATION;  Surgeon: Sherlyn Hay, DO;  Location: [facility];  Service: Gynecology;  Laterality:  N/A;     OB History   No obstetric history on file.      Home Medications    Prior to Admission medications   Medication Sig Start Date End Date Taking? Authorizing Provider  ibuprofen (ADVIL) 200 MG tablet Take 600 mg by mouth every 6 (six) hours as needed for moderate pain.   Yes [provider]  dicyclomine (BENTYL) 20 MG tablet Take 1 tablet (20 mg total) by mouth 2 (two) times daily as needed for spasms. 11/11/18   Adanya Sosinski A, PA-C  ibuprofen (ADVIL,MOTRIN) 600 MG tablet Take 1 tablet (600 mg total) by mouth every 6 (six) hours as needed. Patient not taking: Reported on 11/11/2018 01/22/18   Varney Biles, MD  ondansetron (ZOFRAN) 4 MG tablet Take 1 tablet (4 mg total) by mouth every 8 (eight) hours as needed for nausea or vomiting. 11/11/18   Renita Papa, PA-C    Family History History reviewed. No pertinent family history.  Social History Social History   Tobacco Use   Smoking status: Never Smoker   Smokeless tobacco: Never Used  Substance Use Topics   Alcohol use: Never    Frequency: Never   Drug use: Never     Allergies   Patient has no known allergies.   Review of Systems Review of Systems  Constitutional: Negative for chills and fever.  Respiratory: Negative for cough and shortness of breath.   Cardiovascular: Negative for chest pain.  Gastrointestinal: Positive for abdominal pain, diarrhea and nausea.  Negative for blood in stool, constipation and vomiting.  Genitourinary: Positive for menstrual problem. Negative for dysuria, frequency, hematuria, urgency, vaginal bleeding, vaginal discharge and vaginal pain.  All other systems reviewed and are negative.    Physical Exam Updated Vital Signs BP 116/78    Pulse 71    Temp 98.2 F (36.8 C) (Oral)    Resp 18    Ht  (1.676 m)    Wt 77.1 kg    SpO2 99%    BMI 27.44 kg/m   Physical Exam Vitals signs and nursing note reviewed.  Constitutional:      General: She is not in acute  distress.    Appearance: She is well-developed.     Comments: Resting comfortably in bed  HENT:     Head: Normocephalic and atraumatic.  Eyes:     General:        Right eye: No discharge.        Left eye: No discharge.     Conjunctiva/sclera: Conjunctivae normal.  Neck:     Vascular: No JVD.     Trachea: No tracheal deviation.  Cardiovascular:     Rate and Rhythm: Normal rate and regular rhythm.  Pulmonary:     Effort: Pulmonary effort is normal.     Breath sounds: Normal breath sounds.  Abdominal:     General: Abdomen is flat. Bowel sounds are normal. There is no distension.     Palpations: Abdomen is soft.     Tenderness: There is abdominal tenderness in the right lower quadrant, periumbilical area, suprapubic area and left lower quadrant. There is no right CVA tenderness, left CVA tenderness, guarding or rebound. Negative signs include Murphy's sign and Rovsing's sign.     Comments: Maximally tender to palpation in the periumbilical region  Musculoskeletal:     Comments: No midline spine TTP, no paraspinal muscle tenderness, no deformity, crepitus, or step-off noted   Skin:    General: Skin is warm and dry.     Findings: No erythema.  Neurological:     Mental Status: She is alert.  Psychiatric:        Behavior: Behavior normal.      ED Treatments / Results  Labs (all labs ordered are listed, but only abnormal results are displayed) Labs Reviewed  COMPREHENSIVE METABOLIC PANEL - Abnormal; Notable for the following components:      Result Value   Total Protein 8.3 (*)    All other components within normal limits  URINALYSIS, ROUTINE W REFLEX MICROSCOPIC - Abnormal; Notable for the following components:   APPearance HAZY (*)    Protein, ur 30 (*)    Leukocytes,Ua TRACE (*)    Bacteria, UA RARE (*)    All other components within normal limits  LIPASE, BLOOD  CBC  I-STAT BETA HCG BLOOD, ED ([facility], [facility], [facility] ONLY)    EKG None  Radiology No results  found.  Procedures Procedures (including critical care time)  Medications Ordered in ED Medications - No data to display   Initial Impression / Assessment and Plan / ED Course  I have reviewed the triage vital signs and the nursing notes.  Pertinent labs & imaging results that were available during my care of the patient were reviewed by me and considered in my medical decision making (see chart for details).        Patient presents for evaluation of abdominal pain that has been ongoing for several months.  Mostly constant lower abdominal pain with  intermittent sharp epic periumbilical abdominal pain.  She is afebrile, vital signs are stable.  She is nontoxic in appearance.  No peritoneal signs on examination of the abdomen.  She has irregular periods after D&C in June and IUD removal in August.  No other GU complaints.  Low suspicion of PID, STI, ovarian torsion, ectopic pregnancy, or TOA.  Her pregnancy test today is negative.  UA does not suggest UTI or nephrolithiasis.  Remainder of lab work reviewed by me shows no leukocytosis, no anemia, no metabolic derangements, no renal insufficiency.  Lipase and LFTs are within normal limits.  Doubt acute surgical abdominal pathology given the duration of her symptoms, reassuring physical examination, and no abnormal blood work.  She has had multiple ED work-ups for her symptoms including CT scan and pelvic ultrasound which have been negative.  She has seen a PCP for her symptoms once in September but has not followed up since then.  She is not currently taking anything for her symptoms.  She is also complaining of diarrhea daily, worsens with certain foods.  We discussed dietary modification.  We had a long discussion regarding the utility of further imaging or work-up including pelvic exam, CT scan or ultrasound in the ED today and shared-decision making conversation was had.  She and her significant other discussed this privately as well and decided  that they would like to go home and follow-up with GI on an outpatient basis which I think is reasonable given she is tolerating p.o. and has had symptoms for several months at this point.  We will start her on Bentyl as needed for abdominal cramping, Zofran as needed for nausea.  Discussed dietary modifications.  Again recommend follow-up with GI on an outpatient basis.  Discussed strict ED return precautions.  Patient and significant other verbalized understanding of and agreement with plan and patient stable for discharge home at this time.  Final Clinical Impressions(s) / ED Diagnoses   Final diagnoses:  Periumbilical abdominal pain  Nausea  Diarrhea, unspecified type    ED Discharge Orders         Ordered    Ambulatory referral to Gastroenterology    Comments: Abdominal pain, nausea, diarrhea for several months.   11/11/18 2333    dicyclomine (BENTYL) 20 MG tablet  2 times daily PRN     11/11/18 2337    ondansetron (ZOFRAN) 4 MG tablet  Every 8 hours PRN     11/11/18 2337           Jeanie Sewer, PA-C 11/12/18 1456    Alvira Monday, MD 11/14/18 1307

## 2018-11-11 NOTE — Discharge Instructions (Signed)
Start taking Bentyl as needed for abdominal cramping.  Start taking Zofran as needed for nausea.  Wait around 10 or 15 minutes before you have anything to eat or drink to get this medication time to work.   Drink plenty fluids and get plenty of rest.  I have attached information for dietary modifications that can help relieve diarrhea.   I have ordered a referral to gastroenterology (stomach/gut specialist). They will call to schedule follow up, but you can also call them Monday morning to schedule close outpatient follow-up.  You can also call your OB/GYN for reevaluation of your abnormal periods.  Return to the emergency department if any concerning signs or symptoms develop such as high fevers, persistent vomiting, or severe worsening pain.

## 2018-11-11 NOTE — ED Triage Notes (Signed)
Pt reports abdominal pain at her naval. She also reports nausea and diarrhea intermittently for months. She states that she has been having "pregnancy symptoms," but has had negative pregnancy tests. No naval piercing or discharge from the area.

## 2018-11-12 NOTE — ED Notes (Signed)
Pt left without being d/c  Attempted to call pt and verbalize her AVS and prescriptions  No answer

## 2018-11-14 ENCOUNTER — Encounter: Payer: Self-pay | Admitting: Gastroenterology

## 2018-11-14 ENCOUNTER — Ambulatory Visit (INDEPENDENT_AMBULATORY_CARE_PROVIDER_SITE_OTHER): Payer: 59 | Admitting: Gastroenterology

## 2018-11-14 VITALS — BP 88/60 | HR 77 | Temp 98.6°F | Ht 66.0 in | Wt 179.0 lb

## 2018-11-14 DIAGNOSIS — K529 Noninfective gastroenteritis and colitis, unspecified: Secondary | ICD-10-CM

## 2018-11-14 DIAGNOSIS — R1030 Lower abdominal pain, unspecified: Secondary | ICD-10-CM

## 2018-11-14 MED ORDER — DICYCLOMINE HCL 20 MG PO TABS: 20.0000 mg | ORAL_TABLET | Freq: Two times a day (BID) | ORAL | 1 refills | Status: DC | PRN

## 2018-11-14 NOTE — Progress Notes (Signed)
[facility] Consult Note:  History: Kathy RainwaterShadiyah Leonard 11/14/2018  Referring provider: Grayce SessionsEdwards, Michelle P, NP  Reason for consult/chief complaint: Abdominal Pain (onset 3 months, periumbilical, comes and goes), Diarrhea (always loose), and Nausea (occasionally)   Subjective  HPI:  This is a pleasant 18 year old student at [employer] referred by primary care for chronic abdominal pain. She recalls it beginning sometime in June, events described as below.  06/14/18 Missed abortion, then suction dilation and evacuation with Dr. Mindi SlickerBanga. [place] 06/19/2018 with abdominal pain, diarrhea [place] [facility] 09/25/2018, had labs done, left without being seen due to the wait. [facility], several weeks of lower abdominal pain, nausea vomiting and diarrhea.  IUD reportedly removed about a month prior to that, negative pregnancy test soon afterwards.  Prescribed dicyclomine.  Another ED evaluation 11/11/2018 for same symptoms.  No imaging done.  Prescribed dicyclomine and ondansetron.  She has intermittent periumbilical to lower bandlike abdominal pain, often with urgency and need for loose BM.  Sometimes the pain occurs without the diarrhea.  She had an IUD in briefly, but says it caused side effects that required its removal.  Since then she feels that her menstrual cycle still has not returned to normal, being irregular and brief.  He denies rectal bleeding.  There is episodic nausea with the pain episodes.  She did not pick up the prescription for dicyclomine on either occasion, says she had prefer just not to take any medications. Appetite is remained good and weight stable. ROS:  Review of Systems  Constitutional: Negative for appetite change and unexpected weight change.  HENT: Negative for mouth sores and voice change.   Eyes: Negative for pain and redness.  Respiratory: Negative for cough and shortness of breath.   Cardiovascular: Negative for  chest pain and palpitations.  Genitourinary: Negative for dysuria and hematuria.  Musculoskeletal: Negative for arthralgias and myalgias.  Skin: Negative for pallor and rash.  Neurological: Negative for weakness and headaches.  Hematological: Negative for adenopathy.     Past Medical History: Past Medical History:  Diagnosis Date  . GERD (gastroesophageal reflux disease)      Past Surgical History: Past Surgical History:  Procedure Laterality Date  . DILATION AND EVACUATION N/A 06/14/2018   Procedure: SUCTION DILATATION AND EVACUATION;  Surgeon: Edwinna AreolaBanga, Cecilia Worema, DO;  Location: [facility] OR;  Service: Gynecology;  Laterality: N/A;     Family History: History reviewed. No pertinent family history. No known colorectal cancer or Crohn's disease or ulcerative colitis  Social History: Social History   Socioeconomic History  . Marital status: Single    Spouse name: Not on file  . Number of children: Not on file  . Years of education: Not on file  . Highest education level: Not on file  Occupational History  . Not on file  Social Needs  . Financial resource strain: Not on file  . Food insecurity    Worry: Not on file    Inability: Not on file  . Transportation needs    Medical: Not on file    Non-medical: Not on file  Tobacco Use  . Smoking status: Never Smoker  . Smokeless tobacco: Never Used  Substance and Sexual Activity  . Alcohol use: Never    Frequency: Never  . Drug use: Never  . Sexual activity: Not on file  Lifestyle  . Physical activity    Days per week: Not on file    Minutes per session: Not on file  .  Stress: Not on file  Relationships  . Social Musician on phone: Not on file    Gets together: Not on file    Attends religious service: Not on file    Active member of club or organization: Not on file    Attends meetings of clubs or organizations: Not on file    Relationship status: Not on file  Other Topics Concern  . Not on file   Social History Narrative  . Not on file   Part-time security work, criminal justice student  Allergies: No Known Allergies  Outpatient Meds: Current Outpatient Medications  Medication Sig Dispense Refill  . ibuprofen (ADVIL,MOTRIN) 600 MG tablet Take 1 tablet (600 mg total) by mouth every 6 (six) hours as needed. 20 tablet 0  . dicyclomine (BENTYL) 20 MG tablet Take 1 tablet (20 mg total) by mouth 2 (two) times daily as needed for spasms. 40 tablet 1   No current facility-administered medications for this visit.       ___________________________________________________________________ Objective   Exam:  BP (!) 88/60   Pulse 77   Temp 98.6 F (37 C)   Ht 5\' 6"  (1.676 m)   Wt 179 lb (81.2 kg)   LMP 11/05/2018 (Approximate)   BMI 28.89 kg/m    General: Well-appearing  Eyes: sclera anicteric, no redness  ENT: oral mucosa moist without lesions, no cervical or supraclavicular lymphadenopathy  CV: RRR without murmur, S1/S2, no JVD, no peripheral edema  Resp: clear to auscultation bilaterally, normal RR and effort noted  GI: soft, right and left lower quadrant tenderness, with active bowel sounds. No guarding or palpable organomegaly noted.  Skin; warm and dry, no rash or jaundice noted  Neuro: awake, alert and oriented x 3. Normal gross motor function and fluent speech  Labs:  CBC Latest Ref Rng & Units 11/11/2018 09/25/2018 06/19/2018  WBC 4.0 - 10.5 K/uL 6.6 8.8 6.2  Hemoglobin 12.0 - 15.0 g/dL 06/21/2018 08.6 11.9(L)  Hematocrit 36.0 - 46.0 % 41.3 38.4 35.8(L)  Platelets 150 - 400 K/uL 197 178 217   CMP Latest Ref Rng & Units 11/11/2018 09/25/2018 06/19/2018  Glucose 70 - 99 mg/dL 77 93 97  BUN 6 - 20 mg/dL 10 12 8   Creatinine 0.44 - 1.00 mg/dL 06/21/2018 4.69  Sodium 135 - 145 mmol/L 140 137 136  Potassium 3.5 - 5.1 mmol/L 3.6 3.9 3.6  Chloride 98 - 111 mmol/L 107 108 105  CO2 22 - 32 mmol/L 23 22 23   Calcium 8.9 - 10.3 mg/dL 9.2 9.4 9.1  Total Protein 6.5 - 8.1  g/dL 8.3(H) 7.4 6.8  Total Bilirubin 0.3 - 1.2 mg/dL 0.6 6.29) 0.4  Alkaline Phos 38 - 126 U/L 60 50 51  AST 15 - 41 U/L 25 16 27   ALT 0 - 44 U/L 32 18 45(H)   Negative hCG 09/25/2018 and also on 11/11/2018  Radiologic Studies:  CLINICAL DATA:  Umbilical pain   EXAM: CT ABDOMEN AND PELVIS WITH CONTRAST   TECHNIQUE: Multidetector CT imaging of the abdomen and pelvis was performed using the standard protocol following bolus administration of intravenous contrast.   CONTRAST:  4.1(L OMNIPAQUE IOHEXOL 300 MG/ML  SOLN   COMPARISON:  Right upper quadrant and pelvic ultrasounds   FINDINGS: Lower chest: Lung bases are clear. No effusions. Heart is normal size.   Hepatobiliary: No focal hepatic abnormality. Gallbladder unremarkable.   Pancreas: No focal abnormality or ductal dilatation.   Spleen: No focal abnormality.  Normal size.   Adrenals/Urinary Tract: No adrenal abnormality. No focal renal abnormality. No stones or hydronephrosis. Urinary bladder is unremarkable.   Stomach/Bowel: Stomach, large and small bowel grossly unremarkable. Normal appendix.   Vascular/Lymphatic: No evidence of aneurysm or adenopathy.   Reproductive: Uterus and adnexa unremarkable.  No mass.   Other: No free fluid or free air.   Musculoskeletal: No acute bony abnormality.   IMPRESSION: Normal study.     Electronically Signed   By: Rolm Baptise M.D.   On: 06/20/2018 00:06 (Images personally reviewed, no oral contrast)    ______________________________________________________________ EXAM: ULTRASOUND ABDOMEN LIMITED RIGHT UPPER QUADRANT   COMPARISON:  None.   FINDINGS: Gallbladder:   No gallstones or wall thickening visualized. No sonographic Murphy sign noted by sonographer.   Common bile duct:   Diameter: 4.3 mm   Liver:   No focal lesion identified. Within normal limits in parenchymal echogenicity. Portal vein is patent on color Doppler imaging with normal direction  of blood flow towards the liver.   IMPRESSION: Negative right upper quadrant ultrasound     Electronically Signed   By: Franchot Gallo M.D.   On: 06/19/2018 20:44   Assessment: Encounter Diagnoses  Name Primary?  . Lower abdominal pain Yes  . Chronic diarrhea     Although the symptoms seem to come on shortly after the brief pregnancy and subsequent procedure, it is not clear how or if that could have triggered the symptoms.  She still does seem to have some irregularity of her reproductive hormones, and perhaps that has triggered some functional bowel symptoms.  It does seem most like IBS overall, less likely Crohn's disease.  Plan:  She preferred a trial of dicyclomine other than proceeding with colonoscopy.  I reassured her that her work-up to date has been reassuring, albeit unrevealing.  Dicyclomine 20 mg twice a day as needed.  I have asked her to contact us soon with an update on symptoms.  If not much improved, she will reconsider colonoscopy.  Thank you for the courtesy of this consult.  Please call me with any questions or concerns.  Nelida Meuse III  CC: Referring provider noted above

## 2018-11-14 NOTE — Patient Instructions (Signed)
If you are age 20 or older, your body mass index should be between 23-30. Your Body mass index is 28.89 kg/m. If this is out of the aforementioned range listed, please consider follow up with your Primary Care Provider.  If you are age 50 or younger, your body mass index should be between 19-25. Your Body mass index is 28.89 kg/m. If this is out of the aformentioned range listed, please consider follow up with your Primary Care Provider.   Call us with an update in a few weeks, ask for Francetta Found [phone]  It was a pleasure to see you today!  Dr. Loletha Carrow

## 2018-11-15 ENCOUNTER — Ambulatory Visit (INDEPENDENT_AMBULATORY_CARE_PROVIDER_SITE_OTHER): Payer: 59 | Admitting: Primary Care

## 2019-01-09 ENCOUNTER — Telehealth: Payer: Self-pay | Admitting: Gastroenterology

## 2019-01-09 MED ORDER — DICYCLOMINE HCL 20 MG PO TABS: 20.0000 mg | ORAL_TABLET | Freq: Two times a day (BID) | ORAL | 3 refills | Status: DC | PRN

## 2019-01-09 NOTE — Telephone Encounter (Signed)
Okay to refill? 

## 2019-01-09 NOTE — Telephone Encounter (Signed)
Med refilled.

## 2019-01-09 NOTE — Telephone Encounter (Signed)
Pt stated that dicyclomine has been working for her and requested a refill.

## 2019-01-20 ENCOUNTER — Other Ambulatory Visit: Payer: Self-pay

## 2019-01-20 ENCOUNTER — Emergency Department (HOSPITAL_COMMUNITY)
Admission: EM | Admit: 2019-01-20 | Discharge: 2019-01-20 | Disposition: A | Discharge status: Home / Self Care | Payer: Medicaid Other | Attending: Emergency Medicine | Admitting: Emergency Medicine

## 2019-01-20 ENCOUNTER — Encounter (HOSPITAL_COMMUNITY): Payer: Self-pay

## 2019-01-20 DIAGNOSIS — W208XXA Other cause of strike by thrown, projected or falling object, initial encounter: Secondary | ICD-10-CM | POA: Diagnosis not present

## 2019-01-20 DIAGNOSIS — Y999 Unspecified external cause status: Secondary | ICD-10-CM | POA: Diagnosis not present

## 2019-01-20 DIAGNOSIS — Y929 Unspecified place or not applicable: Secondary | ICD-10-CM | POA: Insufficient documentation

## 2019-01-20 DIAGNOSIS — Y939 Activity, unspecified: Secondary | ICD-10-CM | POA: Insufficient documentation

## 2019-01-20 DIAGNOSIS — S0592XA Unspecified injury of left eye and orbit, initial encounter: Secondary | ICD-10-CM | POA: Diagnosis not present

## 2019-01-20 MED ORDER — KETOROLAC TROMETHAMINE 0.5 % OP SOLN: 1.0000 [drp] | Freq: Four times a day (QID) | OPHTHALMIC | 0 refills | Status: DC

## 2019-01-20 MED ORDER — TETRACAINE HCL 0.5 % OP SOLN
2.0000 [drp] | Freq: Once | OPHTHALMIC | Status: AC
  Administered 2019-01-20: 09:00:00 2 [drp] via OPHTHALMIC
  Filled 2019-01-20: qty 4

## 2019-01-20 MED ORDER — FLUORESCEIN SODIUM 1 MG OP STRP
1.0000 | ORAL_STRIP | Freq: Once | OPHTHALMIC | Status: AC
  Administered 2019-01-20: 09:00:00 1 via OPHTHALMIC
  Filled 2019-01-20: qty 1

## 2019-01-20 MED ORDER — ERYTHROMYCIN 5 MG/GM OP OINT: TOPICAL_OINTMENT | OPHTHALMIC | 0 refills | Status: DC

## 2019-01-20 NOTE — ED Provider Notes (Signed)
[facility] DEPT Provider Note   CSN: [id number] Arrival date & time: 01/20/19  7628     History Chief Complaint  Patient presents with  . Left Eye Injury    Kathy Leonard is a 19 y.o. female with no relevant PMH presents to the ED after sustaining injury to her left eye.  She reports that last night around 10:20 PM she was struck in the left eye with a Nerf dart.  She notes some discomfort, but denies any excruciating pain.  She also endorses photophobia which has caused her headache symptoms.  She denies any visual acuity loss, dizziness, nausea or vomiting, pressure sensation behind the eye, periorbital swelling, eye discharge, fevers or chills, or foreign body sensation.  She is up-to-date on her tetanus vaccine.  She arrives wearing eye patch.  HPI     Past Medical History:  Diagnosis Date  . GERD (gastroesophageal reflux disease)     There are no problems to display for this patient.   Past Surgical History:  Procedure Laterality Date  . DILATION AND EVACUATION N/A 06/14/2018   Procedure: SUCTION DILATATION AND EVACUATION;  Surgeon: Sherlyn Hay, DO;  Location: [facility];  Service: Gynecology;  Laterality: N/A;     OB History   No obstetric history on file.     No family history on file.  Social History   Tobacco Use  . Smoking status: Never Smoker  . Smokeless tobacco: Never Used  Substance Use Topics  . Alcohol use: Never  . Drug use: Never    Home Medications Prior to Admission medications   Medication Sig Start Date End Date Taking? Authorizing Provider  dicyclomine (BENTYL) 20 MG tablet Take 1 tablet (20 mg total) by mouth 2 (two) times daily as needed for spasms. 01/09/19  Yes Danis, Kirke Corin, MD  ibuprofen (ADVIL,MOTRIN) 600 MG tablet Take 1 tablet (600 mg total) by mouth every 6 (six) hours as needed. 01/22/18  Yes Varney Biles, MD  erythromycin ophthalmic ointment Place a 1/2 inch ribbon of ointment into the  lower eyelid. 01/20/19   Corena Herter, PA-C  ketorolac (ACULAR) 0.5 % ophthalmic solution Place 1 drop into the left eye every 6 (six) hours. 01/20/19   Corena Herter, PA-C    Allergies    Patient has no known allergies.  Review of Systems   Review of Systems  Constitutional: Negative for fever.  Eyes: Positive for photophobia, pain and redness. Negative for discharge, itching and visual disturbance.  Neurological: Positive for headaches. Negative for dizziness.    Physical Exam Updated Vital Signs BP 106/87 (BP Location: Left Arm)   Pulse 80   Temp 98.6 F (37 C) (Oral)   Resp 17   Ht 5\' 6"  (1.676 m)   Wt 79.4 kg   LMP 01/10/2019 (Exact Date)   SpO2 98%   BMI 28.25 kg/m   Physical Exam Vitals and nursing note reviewed. Exam conducted with a chaperone present.  Constitutional:      Appearance: Normal appearance.  HENT:     Head: Normocephalic and atraumatic.  Eyes:     General: Lids are normal. Lids are everted, no foreign bodies appreciated. Vision grossly intact.        Left eye: No foreign body, discharge or hordeolum.     Extraocular Movements: Extraocular movements intact.     Left eye: Normal extraocular motion and no nystagmus.     Conjunctiva/sclera:     Left eye: Left conjunctiva  is injected. No chemosis or hemorrhage.    Comments: Left eye: No pain or discomfort with EOM.  PERRL.  No pupillary defects.  Woods lamp exam revealed no ulceration or significant abrasion.  Left eye conjunctiva is injected.  Mild photophobia.  No periorbital swelling or ecchymoses.  No periorbital TTP. Right eye: Entirely normal.  Cardiovascular:     Rate and Rhythm: Normal rate and regular rhythm.     Pulses: Normal pulses.     Heart sounds: Normal heart sounds.  Pulmonary:     Effort: Pulmonary effort is normal. No respiratory distress.     Breath sounds: Normal breath sounds.  Musculoskeletal:     Cervical back: Normal range of motion and neck supple. No rigidity.  Skin:     General: Skin is dry.  Neurological:     General: No focal deficit present.     Mental Status: She is alert and oriented to person, place, and time.     GCS: GCS eye subscore is 4. GCS verbal subscore is 5. GCS motor subscore is 6.     Cranial Nerves: No cranial nerve deficit.     Sensory: No sensory deficit.     Motor: No weakness.     Coordination: Coordination normal.     Gait: Gait normal.  Psychiatric:        Mood and Affect: Mood normal.        Behavior: Behavior normal.        Thought Content: Thought content normal.      ED Results / Procedures / Treatments   Labs (all labs ordered are listed, but only abnormal results are displayed) Labs Reviewed - No data to display  EKG None  Radiology No results found.  Procedures Procedures (including critical care time)  Medications Ordered in ED Medications  tetracaine (PONTOCAINE) 0.5 % ophthalmic solution 2 drop (0 drops Left Eye Hold 01/20/19 0802)  fluorescein ophthalmic strip 1 strip (0 strips Both Eyes Hold 01/20/19 0802)    ED Course  I have reviewed the triage vital signs and the nursing notes.  Pertinent labs & imaging results that were available during my care of the patient were reviewed by me and considered in my medical decision making (see chart for details).    MDM Rules/Calculators/A&P                      Woods lamp revealed no significant abnormalities, but will treat as small corneal abrasion given mechanism of injury in addition to her symptoms.  She does not wear contact lenses.  No pressure sensation behind the eye or discomfort with EOMs.  No fevers or chills.  No periorbital swelling, overlying skin changes, or tenderness to palpation.  Eyelids were everted and there is no evidence of foreign bodies.  Patient is in no acute distress and is only complaining of mild discomfort.    Will prescribe patient ophthalmic NSAID in addition to erythromycin ointment.  I informed patient that her symptoms  will likely abate in 48 to 72 hours.  Recommending ophthalmology follow-up in 2 to 3 days.  Strict return precautions discussed.   Final Clinical Impression(s) / ED Diagnoses Final diagnoses:  Eye injury, non-penetrating, left, initial encounter    Rx / DC Orders ED Discharge Orders         Ordered    ketorolac (ACULAR) 0.5 % ophthalmic solution  Every 6 hours     01/20/19 0842    erythromycin ophthalmic ointment  01/20/19 0842           Lorelee New, PA-C 01/20/19 0845    Linwood Dibbles, MD 01/21/19 [phone]

## 2019-01-20 NOTE — ED Triage Notes (Signed)
Patient arrived via POV with boyfriend. Patient is AOx4 and ambulatory. Patient chief complaint is left eye injury that happened last night around 2200. Patient was hit in the eye with a Nerf Dart. Patient iced eye last night however when patient woke up this morning, patient gets a headache anytime patient opens eye. Eye does have some erythema but no swelling noted.

## 2019-01-20 NOTE — Discharge Instructions (Signed)
Please apply medications, as prescribed.  I recommend wearing sunglasses given your light sensitivity.    Please read the attachment on corneal abrasions.  You will need ophthalmology follow-up in 48 to 72 hours.  Please return to the ER or seek medical attention immediately for any new or worsening symptoms.

## 2019-03-25 ENCOUNTER — Encounter (HOSPITAL_COMMUNITY): Payer: Self-pay | Admitting: Emergency Medicine

## 2019-03-25 ENCOUNTER — Ambulatory Visit (HOSPITAL_COMMUNITY)
Admission: EM | Admit: 2019-03-25 | Discharge: 2019-03-25 | Disposition: A | Discharge status: Home / Self Care | Payer: Medicaid Other | Attending: Emergency Medicine | Admitting: Emergency Medicine

## 2019-03-25 ENCOUNTER — Other Ambulatory Visit: Payer: Self-pay

## 2019-03-25 DIAGNOSIS — R519 Headache, unspecified: Secondary | ICD-10-CM | POA: Diagnosis not present

## 2019-03-25 MED ORDER — NAPROXEN 500 MG PO TABS: 500.0000 mg | ORAL_TABLET | Freq: Two times a day (BID) | ORAL | 0 refills | Status: AC

## 2019-03-25 MED ORDER — KETOROLAC TROMETHAMINE 60 MG/2ML IM SOLN
60.0000 mg | Freq: Once | INTRAMUSCULAR | Status: AC
  Administered 2019-03-25: 15:00:00 60 mg via INTRAMUSCULAR

## 2019-03-25 MED ORDER — KETOROLAC TROMETHAMINE 60 MG/2ML IM SOLN
INTRAMUSCULAR | Status: AC
  Filled 2019-03-25: qty 2

## 2019-03-25 NOTE — ED Triage Notes (Signed)
Pt c/o right sided head pain x 1 week. She denies disorientation or confusion. Pain is constant. Feels better when sleeping. Denies any known head injury. Has been ok with normal ADL. Sometimes pain wakes her up from sleeping.

## 2019-03-25 NOTE — ED Provider Notes (Signed)
[facility]    CSN: [id number] Arrival date & time: 03/25/19  1415      History   Chief Complaint Chief Complaint  Patient presents with  . Headache    HPI Kathy Leonard is a 19 y.o. female history of GERD presenting today for evaluation of a headache.  Patient notes that for the past week she has had intermittent sensations of sharp pen in her right parietal area.  Symptoms will occur for a few seconds but are repetitive for a few hours.  She denies associated vision changes.  Denies difficulty speaking.  Denies weakness.  Denies any chest pain or shortness of breath.  Denies any nausea or vomiting.  Denies history of headaches.  Denies any recent URI symptoms of cough congestion or sore throat.  Does feel pain going into right side of neck, but denies neck stiffness.  Denies fevers.  Has been using Tylenol, ibuprofen and Aleve with occasional relief.   HPI  Past Medical History:  Diagnosis Date  . GERD (gastroesophageal reflux disease)     There are no problems to display for this patient.   Past Surgical History:  Procedure Laterality Date  . DILATION AND EVACUATION N/A 06/14/2018   Procedure: SUCTION DILATATION AND EVACUATION;  Surgeon: Edwinna Areola, DO;  Location: [facility] OR;  Service: Gynecology;  Laterality: N/A;    OB History   No obstetric history on file.      Home Medications    Prior to Admission medications   Medication Sig Start Date End Date Taking? Authorizing Provider  dicyclomine (BENTYL) 20 MG tablet Take 1 tablet (20 mg total) by mouth 2 (two) times daily as needed for spasms. 01/09/19   Sherrilyn Rist, MD  naproxen (NAPROSYN) 500 MG tablet Take 1 tablet (500 mg total) by mouth 2 (two) times daily. 03/25/19   Joaquina Nissen, Junius Creamer, PA-C    Family History History reviewed. No pertinent family history.  Social History Social History   Tobacco Use  . Smoking status: Never Smoker  . Smokeless tobacco: Never Used  Substance Use  Topics  . Alcohol use: Never  . Drug use: Never     Allergies   Patient has no known allergies.   Review of Systems Review of Systems  Constitutional: Negative for fatigue and fever.  HENT: Negative for congestion, sinus pressure and sore throat.   Eyes: Negative for photophobia, pain and visual disturbance.  Respiratory: Negative for cough and shortness of breath.   Cardiovascular: Negative for chest pain.  Gastrointestinal: Negative for abdominal pain, nausea and vomiting.  Genitourinary: Negative for decreased urine volume and hematuria.  Musculoskeletal: Negative for myalgias, neck pain and neck stiffness.  Neurological: Positive for headaches. Negative for dizziness, syncope, facial asymmetry, speech difficulty, weakness, light-headedness and numbness.     Physical Exam Triage Vital Signs ED Triage Vitals  Enc Vitals Group     BP 03/25/19 1434 (!) 95/57     Pulse Rate 03/25/19 1434 73     Resp 03/25/19 1434 14     Temp 03/25/19 1434 98.5 F (36.9 C)     Temp Source 03/25/19 1434 Oral     SpO2 03/25/19 1434 98 %     Weight --      Height --      Head Circumference --      Peak Flow --      Pain Score 03/25/19 1435 5     Pain Loc --  Pain Edu? --      Excl. in GC? --    No data found.  Updated Vital Signs BP (!) 95/57 (BP Location: Left Arm)   Pulse 73   Temp 98.5 F (36.9 C) (Oral)   Resp 14   SpO2 98%  Blood pressure rechecked and was 112/72  Visual Acuity Right Eye Distance:   Left Eye Distance:   Bilateral Distance:    Right Eye Near:   Left Eye Near:    Bilateral Near:     Physical Exam Vitals and nursing note reviewed.  Constitutional:      General: She is not in acute distress.    Appearance: She is well-developed.  HENT:     Head: Normocephalic and atraumatic.     Ears:     Comments: Bilateral ears without tenderness to palpation of external auricle, tragus and mastoid, EAC's without erythema or swelling, TM's with good bony  landmarks and cone of light. Non erythematous.     Mouth/Throat:     Comments: Oral mucosa pink and moist, no tonsillar enlargement or exudate. Posterior pharynx patent and nonerythematous, no uvula deviation or swelling. Normal phonation.  Palate elevates symmetrically Eyes:     Extraocular Movements: Extraocular movements intact.     Conjunctiva/sclera: Conjunctivae normal.     Pupils: Pupils are equal, round, and reactive to light.     Comments: No nystagmus  Cardiovascular:     Rate and Rhythm: Normal rate and regular rhythm.     Heart sounds: No murmur.  Pulmonary:     Effort: Pulmonary effort is normal. No respiratory distress.     Breath sounds: Normal breath sounds.     Comments: Breathing comfortably at rest, CTABL, no wheezing, rales or other adventitious sounds auscultated Abdominal:     Palpations: Abdomen is soft.  Musculoskeletal:     Cervical back: Neck supple.     Comments: Nontender to palpation of cervical spine midline, minimal tenderness to palpation throughout right cervical musculature  Skin:    General: Skin is warm and dry.     Comments: Right parietal area with tenderness to palpation of scalp closer towards the ear  No rash or deformities noted within scalp  Neurological:     General: No focal deficit present.     Mental Status: She is alert and oriented to person, place, and time. Mental status is at baseline.     Comments: Patient A&O x3, cranial nerves II-XII grossly intact, strength at shoulders, hips and knees 5/5, equal bilaterally, patellar reflex 2+ bilaterally.Negative Romberg. Gait without abnormality.      UC Treatments / Results  Labs (all labs ordered are listed, but only abnormal results are displayed) Labs Reviewed - No data to display  EKG   Radiology No results found.  Procedures Procedures (including critical care time)  Medications Ordered in UC Medications  ketorolac (TORADOL) injection 60 mg (has no administration in  time range)    Initial Impression / Assessment and Plan / UC Course  I have reviewed the triage vital signs and the nursing notes.  Pertinent labs & imaging results that were available during my care of the patient were reviewed by me and considered in my medical decision making (see chart for details).     Atypical headache, but no neuro deficits on exam, vital signs stable.  Does have tenderness in the area of reported discomfort.  Will provide Toradol IM and have follow-up with continued use of anti-inflammatories and follow-up with  PCP if continuing to have frequent headaches for further evaluation.  Discussed strict return precautions. Patient verbalized understanding and is agreeable with plan.  Final Clinical Impressions(s) / UC Diagnoses   Final diagnoses:  Acute nonintractable headache, unspecified headache type     Discharge Instructions     We gave you an injection of Toradol today to help with headache Monitor your symptoms over the next 24 to 48 hours for any decrease in frequency of the symptoms May use Naprosyn twice daily as needed with food for any further symptoms  Please follow-up with primary care as planned if symptoms persisting  If symptoms worsening, developing associated vision changes, dizziness, lightheadedness, difficulty speaking, weakness, chest pain please follow-up here or emergency room   ED Prescriptions    Medication Sig Dispense Auth. Provider   naproxen (NAPROSYN) 500 MG tablet Take 1 tablet (500 mg total) by mouth 2 (two) times daily. 30 tablet Cruzita Lipa, [place] C, PA-C     PDMP not reviewed this encounter.   Janith Lima, PA-C 03/25/19 1506

## 2019-03-25 NOTE — Discharge Instructions (Signed)
We gave you an injection of Toradol today to help with headache Monitor your symptoms over the next 24 to 48 hours for any decrease in frequency of the symptoms May use Naprosyn twice daily as needed with food for any further symptoms  Please follow-up with primary care as planned if symptoms persisting  If symptoms worsening, developing associated vision changes, dizziness, lightheadedness, difficulty speaking, weakness, chest pain please follow-up here or emergency room

## 2019-03-29 ENCOUNTER — Other Ambulatory Visit: Payer: Self-pay

## 2019-03-29 ENCOUNTER — Ambulatory Visit (INDEPENDENT_AMBULATORY_CARE_PROVIDER_SITE_OTHER): Payer: Medicaid Other | Admitting: Primary Care

## 2019-03-29 ENCOUNTER — Encounter (INDEPENDENT_AMBULATORY_CARE_PROVIDER_SITE_OTHER): Payer: Self-pay | Admitting: Primary Care

## 2019-03-29 DIAGNOSIS — Z09 Encounter for follow-up examination after completed treatment for conditions other than malignant neoplasm: Secondary | ICD-10-CM | POA: Diagnosis not present

## 2019-03-29 DIAGNOSIS — R519 Headache, unspecified: Secondary | ICD-10-CM

## 2019-03-29 DIAGNOSIS — Z7689 Persons encountering health services in other specified circumstances: Secondary | ICD-10-CM

## 2019-03-29 MED ORDER — IBUPROFEN 800 MG PO TABS: 800.0000 mg | ORAL_TABLET | Freq: Three times a day (TID) | ORAL | 1 refills | Status: AC | PRN

## 2019-03-29 NOTE — Progress Notes (Signed)
Virtual Visit via Telephone Note  I connected with Kathy Leonard on 03/29/19 at  8:30 AM EDT by telephone and verified that I am speaking with the correct person using two identifiers.   I discussed the limitations, risks, security and privacy concerns of performing an evaluation and management service by telephone and the availability of in person appointments. I also discussed with the patient that there may be a patient responsible charge related to this service. The patient expressed understanding and agreed to proceed.   History of Present Illness: [name] is a 19 year old African American female having a tele visit onset of stabbing on right side temporal area for 1 week - seen Urgent care prescribed Naproxen helping a little. She also notice with the headaches she forgets things. 7/10 without medication.  She denies dizziness, syncope, facial asymmetry, speech,difficulty, weakness, light-headed -ness and numbness   Past Medical History:  Diagnosis Date  . GERD (gastroesophageal reflux disease)    Current Outpatient Medications on File Prior to Visit  Medication Sig Dispense Refill  . naproxen (NAPROSYN) 500 MG tablet Take 1 tablet (500 mg total) by mouth 2 (two) times daily. 30 tablet 0   No current facility-administered medications on file prior to visit.   Observations/Objective: Review of Systems  Neurological: Positive for headaches.       Forgetful  All other systems reviewed and are negative.   Assessment and Plan: [name] was seen today for sharp pains in head.  Diagnoses and all orders for this visit:  Encounter to establish care [name], NP-C will be your  (PCP) she is mastered prepared . She is skilled to diagnosed and treat illness. Also able to answer health concern as well as continuing care of varied medical conditions, not limited by cause, organ system, or diagnosis.   Nonintractable episodic headache, unspecified headache  type Headache Management:   Cool Compress. Lie down and place a cool compress on your head.   Avoid headache triggers. If certain foods or odors seem to have triggered your migraines in the past, avoid them. A headache diary might help you identify triggers.   Include physical activity in your daily routine. Try a daily walk or other moderate aerobic exercise.   Manage stress. Find healthy ways to cope with the stressors, such as delegating tasks on your to-do list.   Practice relaxation techniques. Try deep breathing, yoga, massage and visualization.   Eat regularly. Eating regularly scheduled meals and maintaining a healthy diet might help prevent headaches. Also, drink plenty of fluids.   Follow a regular sleep schedule. Sleep deprivation might contribute to headaches  Asked to keep a headache log.   Hospital discharge follow-up Presented right-sided sharp headache last 2 hours or more at a time throughout the day for the past 5 to 6 days.CT of the head reveals no acute intracranial process. If headaches persist follow up with PCP   Other orders -     ibuprofen (ADVIL) 800 MG tablet; Take 1 tablet (800 mg total) by mouth every 8 (eight) hours as needed.    Follow Up Instructions:    I discussed the assessment and treatment plan with the patient. The patient was provided an opportunity to ask questions and all were answered. The patient agreed with the plan and demonstrated an understanding of the instructions.   The patient was advised to call back or seek an in-person evaluation if the symptoms worsen or if the condition fails to improve as anticipated.  I provided 15 minutes of non-face-to-face time during this encounter. Review charts, labs and imaging   [name], NP

## 2019-03-29 NOTE — Progress Notes (Signed)
Pt verified self by name and date of birth Onset of pain last Monday Feels like a stabbing pain- intermittent

## 2020-07-27 IMAGING — CT CT ABDOMEN AND PELVIS WITH CONTRAST
2 of 4 series · 17 of 46 positions shown, 19 images · IV contrast (Omni 300)
Comparison: Right upper quadrant and pelvic ultrasounds

CLINICAL DATA: Umbilical pain

EXAM:
CT ABDOMEN AND PELVIS WITH CONTRAST
TECHNIQUE: Multidetector CT imaging of the abdomen and pelvis was performed
using the standard protocol following bolus administration of
intravenous contrast.
CONTRAST:  100mL OMNIPAQUE IOHEXOL 300 MG/ML  SOLN

[Series 3: a/p w/ 5mm · axial · 0.91mm/px · z∈[+181,+656]mm · 14 of 103 slices shown, 16 images]
[im 4/103  soft-tissue]
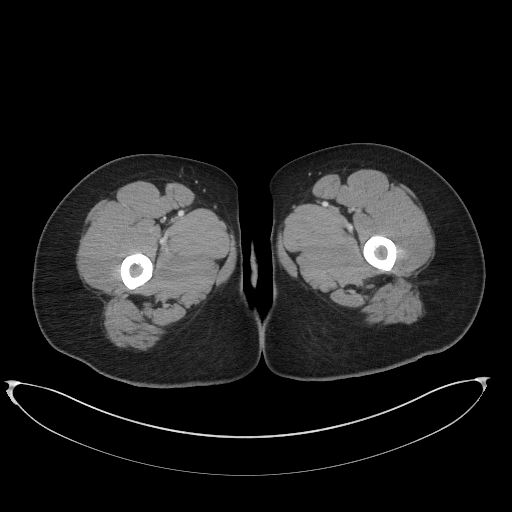
[im 4/103  bone]
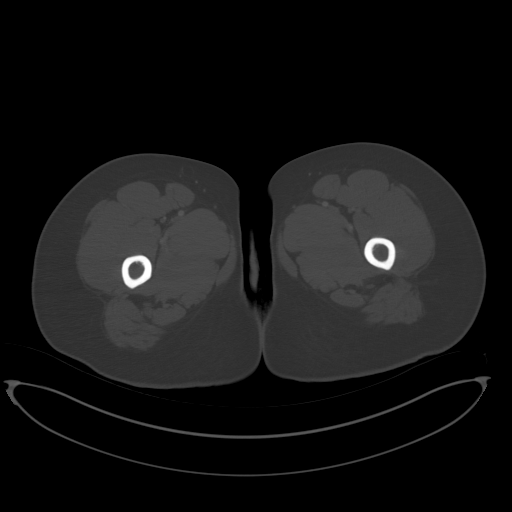
[im 12/103  soft-tissue]
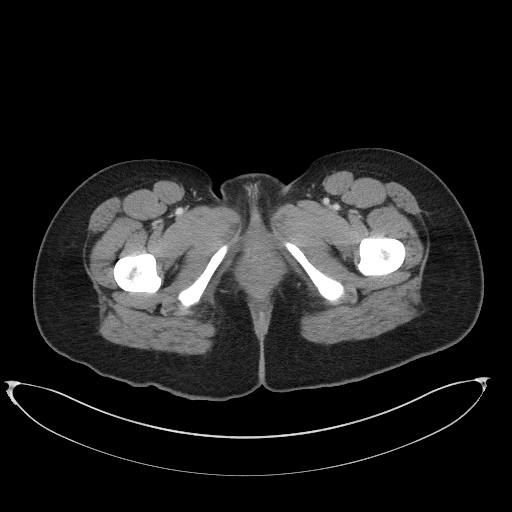
[im 20/103  soft-tissue]
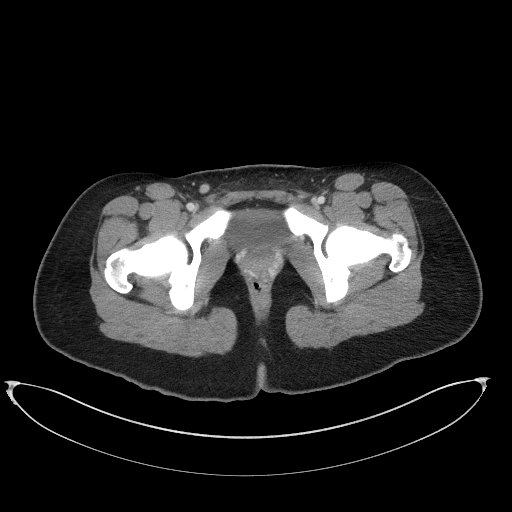
[im 28/103  soft-tissue]
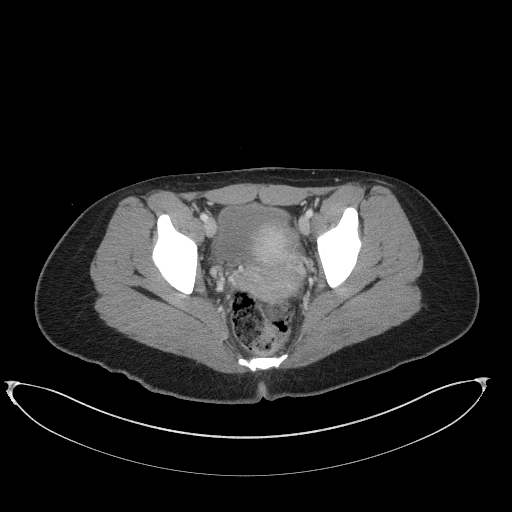
[im 36/103  soft-tissue]
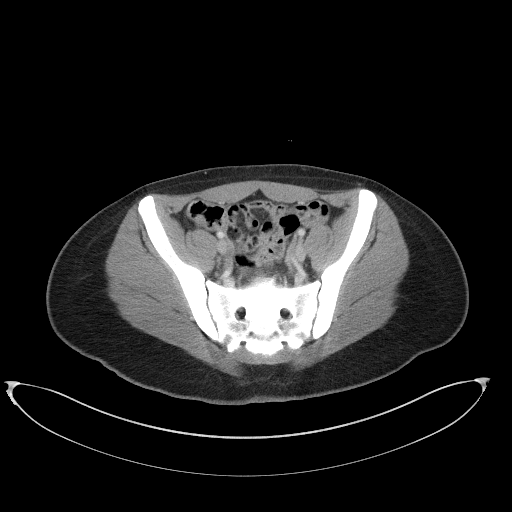
[im 40/103  soft-tissue]
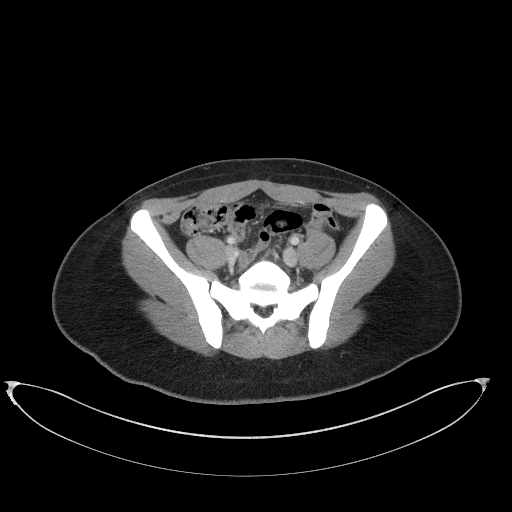
[im 48/103  soft-tissue]
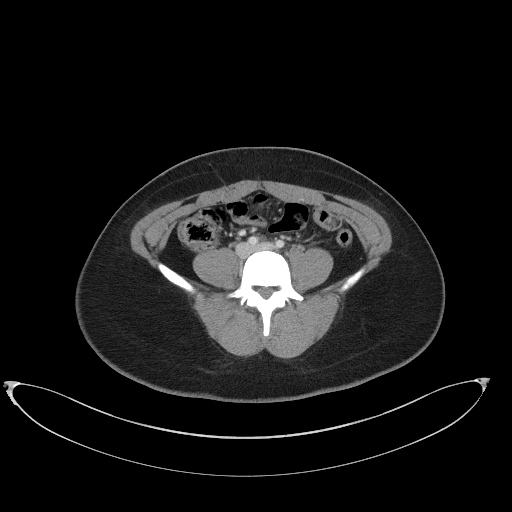
[im 55/103  soft-tissue]
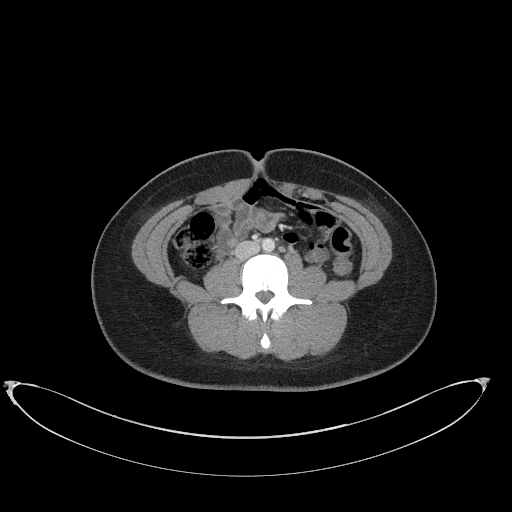
[im 63/103  soft-tissue]
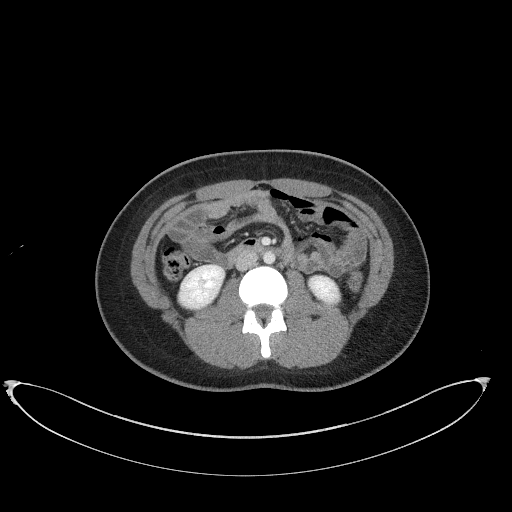
[im 63/103  bone]
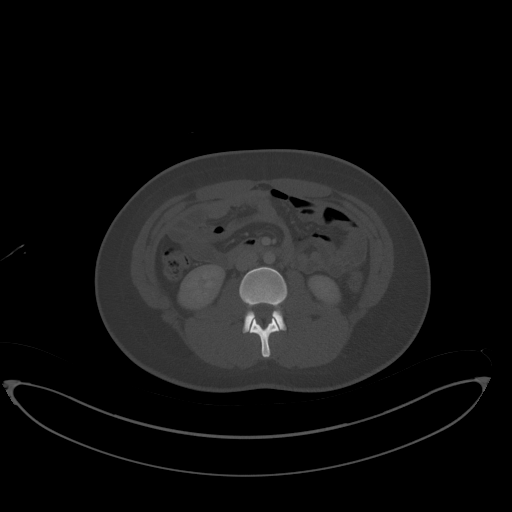
[im 67/103  soft-tissue]
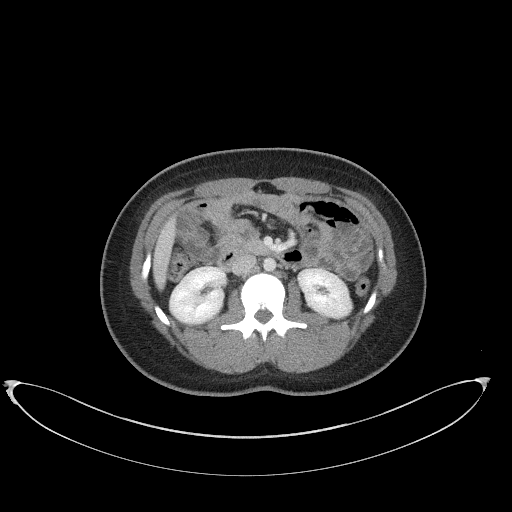
[im 75/103  soft-tissue]
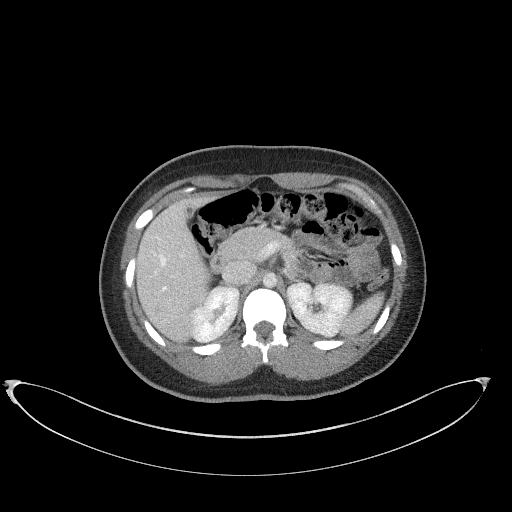
[im 83/103  soft-tissue]
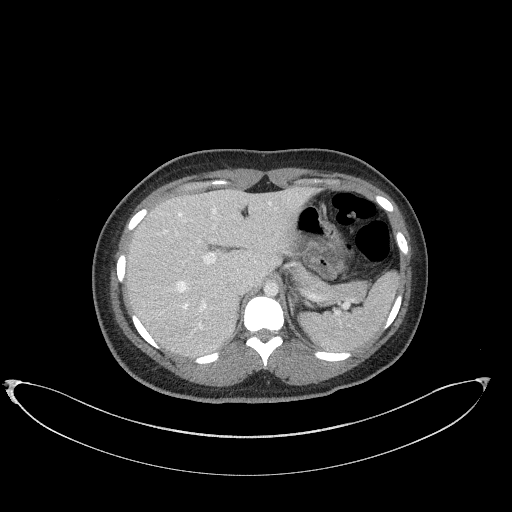
[im 91/103  soft-tissue]
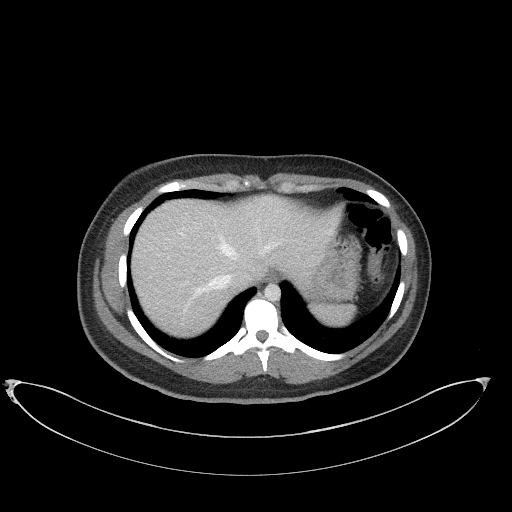
[im 99/103  soft-tissue]
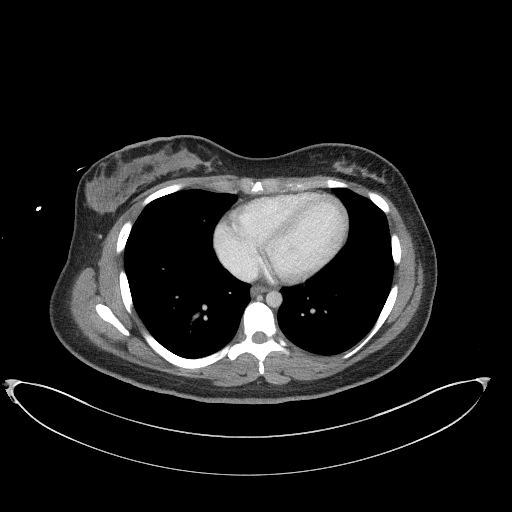

[Series 6: a/p w/ cor · coronal · 0.93mm/px · 3 of 137 slices shown]
[im 46/137  soft-tissue]
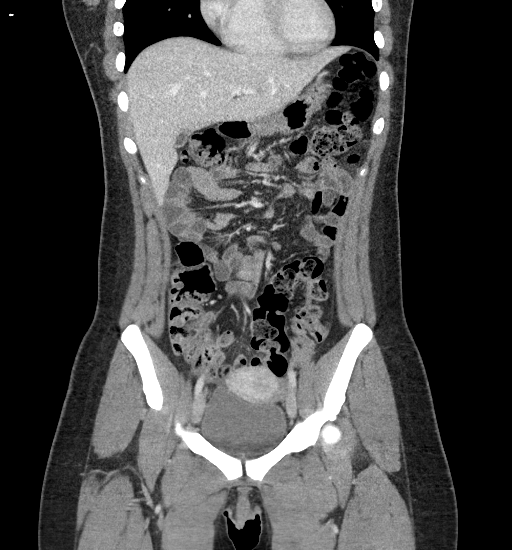
[im 61/137  soft-tissue]
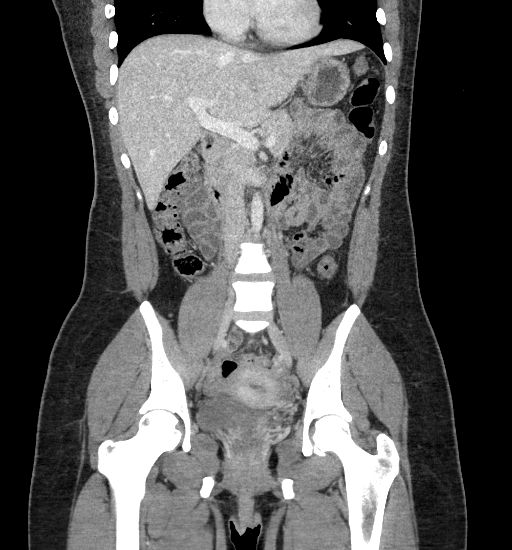
[im 76/137  soft-tissue]
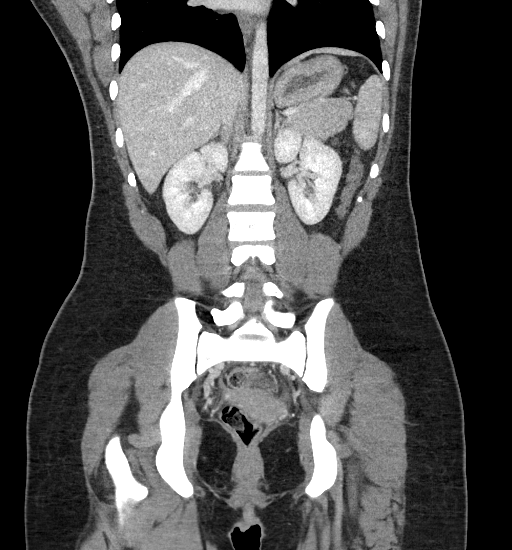

[17 of 46 positions shown; findings below may reference images not displayed]

FINDINGS: Lower chest: Lung bases are clear. No effusions. Heart is normal
size.

Hepatobiliary: No focal hepatic abnormality. Gallbladder
unremarkable.

Pancreas: No focal abnormality or ductal dilatation.

Spleen: No focal abnormality.  Normal size.

Adrenals/Urinary Tract: No adrenal abnormality. No focal renal
abnormality. No stones or hydronephrosis. Urinary bladder is
unremarkable.

Stomach/Bowel: Stomach, large and small bowel grossly unremarkable.
Normal appendix.

Vascular/Lymphatic: No evidence of aneurysm or adenopathy.

Reproductive: Uterus and adnexa unremarkable.  No mass.

Other: No free fluid or free air.

Musculoskeletal: No acute bony abnormality.
IMPRESSION: Normal study.
